# Patient Record
Sex: Female | Born: 1970 | Race: White | Hispanic: No | Smoking: Never smoker
Health system: Southern US, Community
[De-identification: ages and names within clinical notes are randomized; demographics above are authoritative.]

## PROBLEM LIST (undated history)

## (undated) ENCOUNTER — Inpatient Hospital Stay (HOSPITAL_COMMUNITY): Payer: Self-pay

## (undated) DIAGNOSIS — A6000 Herpesviral infection of urogenital system, unspecified: Secondary | ICD-10-CM

## (undated) DIAGNOSIS — F329 Major depressive disorder, single episode, unspecified: Secondary | ICD-10-CM

## (undated) DIAGNOSIS — N200 Calculus of kidney: Secondary | ICD-10-CM

## (undated) DIAGNOSIS — F419 Anxiety disorder, unspecified: Secondary | ICD-10-CM

## (undated) DIAGNOSIS — F32A Depression, unspecified: Secondary | ICD-10-CM

## (undated) DIAGNOSIS — E785 Hyperlipidemia, unspecified: Secondary | ICD-10-CM

## (undated) HISTORY — DX: Anxiety disorder, unspecified: F41.9

## (undated) HISTORY — PX: OTHER SURGICAL HISTORY: SHX169

## (undated) HISTORY — DX: Calculus of kidney: N20.0

## (undated) HISTORY — PX: BREAST EXCISIONAL BIOPSY: SUR124

## (undated) HISTORY — DX: Major depressive disorder, single episode, unspecified: F32.9

## (undated) HISTORY — PX: WISDOM TOOTH EXTRACTION: SHX21

## (undated) HISTORY — DX: Herpesviral infection of urogenital system, unspecified: A60.00

## (undated) HISTORY — DX: Hyperlipidemia, unspecified: E78.5

## (undated) HISTORY — DX: Depression, unspecified: F32.A

---

## 2001-03-25 ENCOUNTER — Emergency Department (HOSPITAL_COMMUNITY): Admission: EM | Admit: 2001-03-25 | Discharge: 2001-03-25 | Payer: Self-pay | Admitting: Gastroenterology

## 2001-03-25 ENCOUNTER — Encounter: Payer: Self-pay | Admitting: Emergency Medicine

## 2003-09-03 ENCOUNTER — Encounter: Admission: RE | Admit: 2003-09-03 | Discharge: 2003-09-03 | Payer: Self-pay | Admitting: Obstetrics & Gynecology

## 2004-04-04 ENCOUNTER — Encounter: Admission: RE | Admit: 2004-04-04 | Discharge: 2004-04-04 | Payer: Self-pay | Admitting: Obstetrics & Gynecology

## 2004-10-30 ENCOUNTER — Encounter: Admission: RE | Admit: 2004-10-30 | Discharge: 2004-10-30 | Payer: Self-pay | Admitting: Obstetrics & Gynecology

## 2005-08-13 ENCOUNTER — Other Ambulatory Visit: Admission: RE | Admit: 2005-08-13 | Discharge: 2005-08-13 | Payer: Self-pay | Admitting: Gynecology

## 2006-04-24 ENCOUNTER — Emergency Department (HOSPITAL_COMMUNITY): Admission: EM | Admit: 2006-04-24 | Discharge: 2006-04-24 | Payer: Self-pay | Admitting: Family Medicine

## 2006-08-28 ENCOUNTER — Other Ambulatory Visit: Admission: RE | Admit: 2006-08-28 | Discharge: 2006-08-28 | Payer: Self-pay | Admitting: Gynecology

## 2007-07-29 ENCOUNTER — Other Ambulatory Visit: Admission: RE | Admit: 2007-07-29 | Discharge: 2007-07-29 | Payer: Self-pay | Admitting: Gynecology

## 2008-07-29 ENCOUNTER — Other Ambulatory Visit: Admission: RE | Admit: 2008-07-29 | Discharge: 2008-07-29 | Payer: Self-pay | Admitting: Gynecology

## 2009-01-18 ENCOUNTER — Ambulatory Visit (HOSPITAL_COMMUNITY): Admission: RE | Admit: 2009-01-18 | Discharge: 2009-01-18 | Payer: Self-pay | Admitting: Gynecology

## 2009-06-20 ENCOUNTER — Ambulatory Visit (HOSPITAL_COMMUNITY): Admission: RE | Admit: 2009-06-20 | Discharge: 2009-06-20 | Payer: Self-pay | Admitting: Gynecology

## 2011-06-01 ENCOUNTER — Ambulatory Visit (HOSPITAL_BASED_OUTPATIENT_CLINIC_OR_DEPARTMENT_OTHER): Payer: BC Managed Care – PPO | Attending: Family Medicine

## 2011-06-01 DIAGNOSIS — G4710 Hypersomnia, unspecified: Secondary | ICD-10-CM | POA: Insufficient documentation

## 2011-06-02 DIAGNOSIS — G4730 Sleep apnea, unspecified: Secondary | ICD-10-CM

## 2011-06-02 DIAGNOSIS — G4710 Hypersomnia, unspecified: Secondary | ICD-10-CM

## 2011-06-04 NOTE — Procedures (Signed)
Sara Schultz, Sara Schultz          ACCOUNT NO.:  [id number]  MEDICAL RECORD NO.:  [id number]           PATIENT TYPE:  OUT  LOCATION:  SLEEP CENTER                 FACILITY:  [facility]  PHYSICIAN:  Clinton D. Maple Hudson, MD, FCCP, FACPDATE OF BIRTH:  DATE OF STUDY:  06/01/2011                           NOCTURNAL POLYSOMNOGRAM  REFERRING PHYSICIAN:  Raynelle Jan, M.D.  INDICATION FOR STUDY:  Hypersomnia with sleep apnea.  EPWORTH SLEEPINESS SCORE:  10/24, BMI 20.8.  Weight 133 pounds, height 67 inches.  Neck 13 inches.  MEDICATIONS:  Home medications are charted and reviewed.  SLEEP ARCHITECTURE:  Total sleep time 288.5 minutes with sleep efficiency 68.6%.  Stage I was 5.2%, stage II 92.2%, stage III absent, REM 2.6% of total sleep time.  Sleep latency 75.5 minutes, REM latency 122 minutes, awake after sleep onset 56 minutes, arousal index 7.9. Bedtime Medication:  Sertraline.  RESPIRATORY DATA:  Apnea/hypopnea index (AHI) 0.2 per hour.  A single event was recorded as a central apnea while nonsupine in nonREM sleep.  OXYGEN DATA:  Mild snoring with oxygen desaturation to a nadir of 94% and a mean oxygen saturation through the study of 96.6% on room air.  CARDIAC DATA:  Normal sinus rhythm.  MOVEMENT-PARASOMNIA:  No significant movement disturbance.  No bathroom trips.  IMPRESSION-RECOMMENDATIONS: 1. Unremarkable sleep architecture for sleep center environment except     for reduced percentage time spent in REM which may reflect first     night in an unfamiliar sleep environment. 2. Insignificant respiratory sleep disturbance with a single event,     within normal limits.  AHI 0.2 per hour (normal adult range is from     0 to 5 per hour).  Mild snoring with oxygen desaturation to a nadir     of 94% and mean oxygen saturation through the study of 96.6% on     room air.    Clinton D. Maple Hudson, MD, [facility], FACP Diplomate, Biomedical engineer of Sleep Medicine Electronically  Signed   CDY/MEDQ  D:  06/02/2011 14:15:47  T:  06/02/2011 19:56:22  Job:  [id number]

## 2011-06-20 ENCOUNTER — Encounter (HOSPITAL_BASED_OUTPATIENT_CLINIC_OR_DEPARTMENT_OTHER): Payer: Self-pay

## 2011-06-26 ENCOUNTER — Telehealth: Payer: Self-pay | Admitting: Pulmonary Disease

## 2011-06-26 NOTE — Telephone Encounter (Signed)
lmom for pt and made her aware we do not have the records but once we get her release of info we will call the referring office and request the info to be sent prior to her appt . Aundra Millet this is just a heads up for you if you would like to try and request these records early--no records in file cabinet as of 06/26/11 at 4:45.

## 2011-06-29 ENCOUNTER — Encounter: Payer: Self-pay | Admitting: Pulmonary Disease

## 2011-06-29 ENCOUNTER — Ambulatory Visit (INDEPENDENT_AMBULATORY_CARE_PROVIDER_SITE_OTHER): Payer: BC Managed Care – PPO | Admitting: Pulmonary Disease

## 2011-06-29 VITALS — BP 126/72 | HR 82 | Temp 98.8°F | Ht 67.0 in | Wt 132.6 lb

## 2011-06-29 DIAGNOSIS — G4700 Insomnia, unspecified: Secondary | ICD-10-CM | POA: Insufficient documentation

## 2011-06-29 MED ORDER — TRAZODONE HCL 50 MG PO TABS: ORAL_TABLET | ORAL | Status: DC

## 2011-06-29 NOTE — Patient Instructions (Signed)
Stop klonopine for now Trial of trazodone 50mg  one at bedtime for first week, and can increase to 2 at bedtime thereafter if needed. Trial of melatonin 3mg  about 3-4 hrs before bedtime Talk with husband about kicking at night? Call me in 3 weeks with your response to the above.

## 2011-06-29 NOTE — Progress Notes (Signed)
  Subjective:    Patient ID: Sara Schultz, female    DOB: 08-14-1971, 40 y.o.   MRN: [id number]  HPI The patient is a 40 year old female who comes in today as a self-referral for sleeping issues.  The patient states that she has been a light sleeper for years, and this dates back to at least her college years.  She has never had issues with sleep initiation, but has very frequent awakenings during the night.  She usually gets back to sleep very quickly, with 5-10 minutes being the maximum.  She has no idea what is causing these awakenings.  Her husband who snores has moved to another room, and the pet has gone with him.  She has adjusted room temperature, has no issues with lighting, has considered pillow and mattress being an issue, and wears ear plugs in order to keep the noise minimized.  She states that her husband has never mentioned kicking during the night, and she denies any symptoms consistent with restless leg syndrome.  She has no issues with sleep walking or other abnormal behaviors according to her husband.  She denies palpitations, and has no history of a seizure disorder.  She has had a recent sleep study that showed no evidence for sleep-disordered breathing or other issues that may cause awakenings.  The patient has tried Benadryl, amitriptyline, as well as Klonopin in the past.  The amitriptyline did help, but she required high enough doses that resulted in side effects.  She has nonrestorative sleep in the mornings, and significant fatigue and lack of concentration at work.  She denies being an "owl".   Review of Systems  Constitutional: Positive for unexpected weight change. Negative for fever.  HENT: Negative for ear pain, nosebleeds, congestion, sore throat, rhinorrhea, sneezing, trouble swallowing, dental problem, postnasal drip and sinus pressure.   Eyes: Negative for redness and itching.  Respiratory: Negative for cough, chest tightness, shortness of breath and wheezing.     Cardiovascular: Negative for palpitations and leg swelling.  Gastrointestinal: Positive for abdominal pain. Negative for nausea and vomiting.  Genitourinary: Negative for dysuria.  Musculoskeletal: Positive for joint swelling.  Skin: Negative for rash.  Neurological: Negative for headaches.  Hematological: Does not bruise/bleed easily.  Psychiatric/Behavioral: Positive for dysphoric mood. The patient is nervous/anxious.        Objective:   Physical Exam Constitutional:  Well developed, no acute distress  HENT:  Nares patent without discharge  Oropharynx without exudate, palate and uvula are normal  Eyes:  Perrla, eomi, no scleral icterus  Neck:  No JVD, no TMG  Cardiovascular:  Normal rate, regular rhythm, no rubs or gallops.  No murmurs        Intact distal pulses  Pulmonary :  Normal breath sounds, no stridor or respiratory distress   No rales, rhonchi, or wheezing  Abdominal:  Soft, nondistended, bowel sounds present.  No tenderness noted.   Musculoskeletal:  No lower extremity edema noted.  Lymph Nodes:  No cervical lymphadenopathy noted  Skin:  No cyanosis noted  Neurologic:  Alert, appropriate, moves all 4 extremities without obvious deficit.         Assessment & Plan:

## 2011-06-29 NOTE — Assessment & Plan Note (Signed)
The patient has a long history of "light sleeping", but currently is having very frequent awakenings at night of unknown origin.  She has worked very hard to improve her sleeping environment, and denies any history suggestive of RLS, arrhythmia, or parasomnia.  She has had a recent sleep study that was normal except for delayed sleep onset and very little slow-wave sleep or REM.  Her issue may simply be related to anxiety that interferes with her sleep at night.  At this point, I would like to try her on trazodone as well as melatonin to see if she sees a difference.  I have also asked her to discuss with her husband possible leg movements or other abnormal behaviors.  She will call in approximately 3 weeks with her response to her treatment trial

## 2011-07-03 ENCOUNTER — Telehealth: Payer: Self-pay | Admitting: Pulmonary Disease

## 2011-07-03 NOTE — Telephone Encounter (Signed)
This may be consistent with the restless leg syndrome, which can cause sleep disruption during the night. Can try requip 0.5mg  after dinner each night for about 3 weeks, and see if this helps.  She can do this in the place of trazodone initially, but if no difference, go back to original plan.
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
## 2011-07-03 NOTE — Telephone Encounter (Signed)
I think she must be calling to advise KC if she is kicking in her sleep- called pt and had to [facility].

## 2011-07-04 MED ORDER — ROPINIROLE HCL 0.5 MG PO TABS: ORAL_TABLET | ORAL | Status: DC

## 2011-07-04 NOTE — Telephone Encounter (Signed)
Patient returning call.

## 2011-07-04 NOTE — Telephone Encounter (Signed)
LMTCB

## 2011-07-04 NOTE — Telephone Encounter (Signed)
Pt is aware of KC's recs and rx for requip has been sent to the pharmacy.

## 2011-07-06 ENCOUNTER — Encounter: Payer: Self-pay | Admitting: Pulmonary Disease

## 2011-11-11 ENCOUNTER — Inpatient Hospital Stay (HOSPITAL_COMMUNITY)
Admission: AD | Admit: 2011-11-11 | Discharge: 2011-11-12 | Disposition: A | Discharge status: Home / Self Care | Payer: BC Managed Care – PPO | Source: Ambulatory Visit | Attending: Gynecology | Admitting: Gynecology

## 2011-11-11 ENCOUNTER — Encounter (HOSPITAL_COMMUNITY): Payer: Self-pay | Admitting: *Deleted

## 2011-11-11 DIAGNOSIS — O039 Complete or unspecified spontaneous abortion without complication: Secondary | ICD-10-CM | POA: Insufficient documentation

## 2011-11-11 NOTE — ED Provider Notes (Signed)
History     CSN: [id number]  Arrival date & time 11/11/11  2229   None     Chief Complaint  Patient presents with  . Miscarriage      HPI Sara Schultz is a 41 y.o. female who presents to [facility] for cramping and bleeding in pregnancy. The bleeding started 2 days ago and then today got heavier with severe cramping. She is a patient of Dr. Chevis Pretty and has known this was a failed pregnancy since [redacted] weeks gestation. The plan was to have a D&C after her URI was better. The history was provided by the patient.  Past Medical History  Diagnosis Date  . Allergic rhinitis     Past Surgical History  Procedure Date  . Wisdom tooth extraction     Family History  Problem Relation Age of Onset  . Heart disease Father   . Throat cancer Maternal Grandfather     History  Substance Use Topics  . Smoking status: Never Smoker   . Smokeless tobacco: Not on file  . Alcohol Use: No    OB History    Grav Para Term Preterm Abortions TAB SAB Ect Mult Living   4 0 0 0 3 0 3 0 0 0       Review of Systems  Constitutional: Negative for fever, chills, diaphoresis and fatigue.  HENT: Negative for ear pain, congestion, sore throat, facial swelling, neck pain, neck stiffness, dental problem and sinus pressure.   Eyes: Negative for photophobia, pain and discharge.  Respiratory: Negative for cough, chest tightness and wheezing.   Gastrointestinal: Positive for nausea and abdominal pain. Negative for vomiting, diarrhea, constipation and abdominal distention.  Genitourinary: Positive for vaginal bleeding and pelvic pain. Negative for dysuria, frequency, flank pain and difficulty urinating.  Musculoskeletal: Positive for back pain. Negative for myalgias and gait problem.  Skin: Negative for color change and rash.  Neurological: Positive for dizziness. Negative for speech difficulty, weakness, light-headedness, numbness and headaches.  Psychiatric/Behavioral: Negative for confusion and agitation.     Allergies  Penicillins  Home Medications  No current outpatient prescriptions on file.  BP 132/82  Pulse 100  Temp(Src) 98.2 F (36.8 C) (Oral)  Resp 20  Ht 5\' 7"  (1.702 m)  Wt 133 lb (60.328 kg)  BMI 20.83 kg/m2  SpO2 98%  Physical Exam  Nursing note and vitals reviewed. Constitutional: She is oriented to person, place, and time. She appears well-developed and well-nourished. No distress.       Uncomfortable appearing.  Eyes: EOM are normal.  Neck: Neck supple.  Cardiovascular: Normal rate.   Pulmonary/Chest: Effort normal.  Abdominal: Soft. There is no tenderness.  Genitourinary:       External genitalia without lesions. Large blood clots vaginal vault. Cervix closed, no CMT, mildly tender bilateral adnexa. Uterus slightly enlarged.  Musculoskeletal: Normal range of motion.  Neurological: She is alert and oriented to person, place, and time. No cranial nerve deficit.  Skin: Skin is warm and dry.  Psychiatric: Her behavior is normal. Judgment and thought content normal. Her mood appears anxious.   Results for orders placed during the hospital encounter of 11/11/11 (from the past 24 hour(s))  CBC     Status: Abnormal   Collection Time   11/12/11 12:21 AM      Component Value Range   WBC 15.4 (*) 4.0 - 10.5 (K/uL)   RBC 4.15  3.87 - 5.11 (MIL/uL)   Hemoglobin 12.3  12.0 - 15.0 (g/dL)   HCT 37.1  36.0 - 46.0 (%)   MCV 89.4  78.0 - 100.0 (fL)   MCH 29.6  26.0 - 34.0 (pg)   MCHC 33.2  30.0 - 36.0 (g/dL)   RDW 19.1  47.8 - 29.5 (%)   Platelets 201  150 - 400 (K/uL)   Assessment: SAB in progress  ED Course: consult with Dr. Henderson Cloud on call for Dr. Chevis Pretty. Will start IV and give medication for pain and observe patient. Will call Dr. Henderson Cloud with update if patient completes SAB or for change in status.  Procedures  04:14 am MDM: Bleeding decreased significantly, no pain at this time. Patient returned from ultrasound.   US Ob Comp Less 14 Wks  11/12/2011  *RADIOLOGY  REPORT*  Clinical Data: Evaluate for retained products of conception.  OBSTETRIC <14 WK [place] AND TRANSVAGINAL OB US  Technique:  Both transabdominal and transvaginal ultrasound examinations were performed for complete evaluation of the gestation as well as the maternal uterus, adnexal regions, and pelvic cul-de-sac.  Transvaginal technique was performed to assess early pregnancy.  Comparison:  None.  No intrauterine gestational sac identified.  The endometrium is thickened at 2 cm and heterogeneous in echogenicity, with internal debris.  There is a small amount of blood flow noted to the endometrial within the fundal portion.  Ovaries have normal sonographic appearance.  No free fluid identified.  IMPRESSION: Thickened heterogeneous endometrium with vascularity to the fundal portion. While this in part likely represents intrauterine blood clots, retained products of conception within the fundal portion is not excluded.  Original Report Authenticated By: Waneta Martins, M.D.   US Ob Transvaginal  11/12/2011  *RADIOLOGY REPORT*  Clinical Data: Evaluate for retained products of conception.  OBSTETRIC <14 WK [place] AND TRANSVAGINAL OB US  Technique:  Both transabdominal and transvaginal ultrasound examinations were performed for complete evaluation of the gestation as well as the maternal uterus, adnexal regions, and pelvic cul-de-sac.  Transvaginal technique was performed to assess early pregnancy.  Comparison:  None.  No intrauterine gestational sac identified.  The endometrium is thickened at 2 cm and heterogeneous in echogenicity, with internal debris.  There is a small amount of blood flow noted to the endometrial within the fundal portion.  Ovaries have normal sonographic appearance.  No free fluid identified.  IMPRESSION: Thickened heterogeneous endometrium with vascularity to the fundal portion. While this in part likely represents intrauterine blood clots, retained products of conception within the fundal  portion is not excluded.  Original Report Authenticated By: Waneta Martins, M.D.   Discussed ultrasound results with Dr. Henderson Cloud and will discharge patient home to follow up with Dr. Chevis Pretty in the office later today.  Assessment: Complete SAB  Plan:  Follow up with Dr. Chevis Pretty in the office today.   Return here as needed.        [place], [place] 11/12/11 [phone]

## 2011-11-11 NOTE — Progress Notes (Signed)
H. Neese, NP at bedside.  Assessment done and poc discussed with pt.  

## 2011-11-11 NOTE — Progress Notes (Signed)
SSE per NP.  VE done.   

## 2011-11-11 NOTE — Progress Notes (Signed)
Pt states she has known she would miscarry since 7 weeks-they were just waiting for it to happen

## 2011-11-12 ENCOUNTER — Other Ambulatory Visit: Payer: Self-pay | Admitting: Gynecology

## 2011-11-12 ENCOUNTER — Inpatient Hospital Stay (HOSPITAL_COMMUNITY): Payer: BC Managed Care – PPO

## 2011-11-12 LAB — DIFFERENTIAL
Eosinophils Absolute: 0.1 10*3/uL (ref 0.0–0.7)
Lymphocytes Relative: 14 % (ref 12–46)
Lymphs Abs: 2.2 10*3/uL (ref 0.7–4.0)
Monocytes Relative: 6 % (ref 3–12)
Neutrophils Relative %: 80 % — ABNORMAL HIGH (ref 43–77)

## 2011-11-12 LAB — CBC
Hemoglobin: 12.3 g/dL (ref 12.0–15.0)
MCH: 29.6 pg (ref 26.0–34.0)
MCHC: 33.2 g/dL (ref 30.0–36.0)
Platelets: 201 10*3/uL (ref 150–400)

## 2011-11-12 LAB — TYPE AND SCREEN: ABO/RH(D): O POS

## 2011-11-12 MED ORDER — LACTATED RINGERS IV SOLN
INTRAVENOUS | Status: DC
  Administered 2011-11-12: via INTRAVENOUS

## 2011-11-12 MED ORDER — HYDROMORPHONE HCL PF 1 MG/ML IJ SOLN
1.0000 mg | Freq: Once | INTRAMUSCULAR | Status: AC
  Administered 2011-11-12: 1 mg via INTRAVENOUS
  Filled 2011-11-12: qty 1

## 2011-11-12 MED ORDER — KETOROLAC TROMETHAMINE 30 MG/ML IJ SOLN
30.0000 mg | Freq: Once | INTRAMUSCULAR | Status: AC
  Administered 2011-11-12: 30 mg via INTRAVENOUS
  Filled 2011-11-12: qty 1

## 2011-11-12 MED ORDER — ONDANSETRON HCL 4 MG/2ML IJ SOLN
4.0000 mg | Freq: Once | INTRAMUSCULAR | Status: AC
  Administered 2011-11-12: 4 mg via INTRAVENOUS
  Filled 2011-11-12: qty 2

## 2012-03-10 ENCOUNTER — Emergency Department (HOSPITAL_COMMUNITY)
Admission: EM | Admit: 2012-03-10 | Discharge: 2012-03-10 | Disposition: A | Discharge status: Home / Self Care | Payer: BC Managed Care – PPO | Source: Home / Self Care

## 2012-03-10 ENCOUNTER — Encounter (HOSPITAL_COMMUNITY): Payer: Self-pay | Admitting: *Deleted

## 2012-03-10 DIAGNOSIS — S0500XA Injury of conjunctiva and corneal abrasion without foreign body, unspecified eye, initial encounter: Secondary | ICD-10-CM

## 2012-03-10 DIAGNOSIS — S058X9A Other injuries of unspecified eye and orbit, initial encounter: Secondary | ICD-10-CM

## 2012-03-10 MED ORDER — TETRACAINE HCL 0.5 % OP SOLN
2.0000 [drp] | Freq: Once | OPHTHALMIC | Status: AC
  Administered 2012-03-10: 2 [drp] via OPHTHALMIC

## 2012-03-10 MED ORDER — ERYTHROMYCIN 5 MG/GM OP OINT: TOPICAL_OINTMENT | Freq: Four times a day (QID) | OPHTHALMIC | Status: AC

## 2012-03-10 MED ORDER — TETRACAINE HCL 0.5 % OP SOLN
OPHTHALMIC | Status: AC
  Filled 2012-03-10: qty 2

## 2012-03-10 NOTE — ED Notes (Signed)
C/O bilat eye irritation since 5/25 - L>R.  Has been using Alaway and Visine eye drops.
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
## 2013-04-02 ENCOUNTER — Ambulatory Visit: Payer: BC Managed Care – PPO | Admitting: Family Medicine

## 2013-04-07 ENCOUNTER — Ambulatory Visit (INDEPENDENT_AMBULATORY_CARE_PROVIDER_SITE_OTHER): Payer: BC Managed Care – PPO | Admitting: Family Medicine

## 2013-04-07 ENCOUNTER — Encounter: Payer: Self-pay | Admitting: Family Medicine

## 2013-04-07 VITALS — BP 120/80 | HR 64 | Temp 97.6°F | Ht 67.0 in | Wt 139.0 lb

## 2013-04-07 DIAGNOSIS — Z136 Encounter for screening for cardiovascular disorders: Secondary | ICD-10-CM

## 2013-04-07 DIAGNOSIS — F329 Major depressive disorder, single episode, unspecified: Secondary | ICD-10-CM

## 2013-04-07 DIAGNOSIS — F32A Depression, unspecified: Secondary | ICD-10-CM | POA: Insufficient documentation

## 2013-04-07 DIAGNOSIS — J309 Allergic rhinitis, unspecified: Secondary | ICD-10-CM

## 2013-04-07 DIAGNOSIS — Z0000 Encounter for general adult medical examination without abnormal findings: Secondary | ICD-10-CM

## 2013-04-07 LAB — COMPREHENSIVE METABOLIC PANEL
Albumin: 4 g/dL (ref 3.5–5.2)
Alkaline Phosphatase: 42 U/L (ref 39–117)
BUN: 15 mg/dL (ref 6–23)
CO2: 27 mEq/L (ref 19–32)
Calcium: 9.3 mg/dL (ref 8.4–10.5)
Chloride: 104 mEq/L (ref 96–112)
GFR: 72.72 mL/min (ref >60.00)
Glucose, Bld: 79 mg/dL (ref 70–99)
Potassium: 4.4 mEq/L (ref 3.5–5.1)

## 2013-04-07 LAB — LIPID PANEL
Cholesterol: 159 mg/dL (ref 0–200)
Triglycerides: 112 mg/dL (ref 0.0–149.0)

## 2013-04-07 MED ORDER — BUPROPION HCL ER (XL) 150 MG PO TB24: 150.0000 mg | ORAL_TABLET | Freq: Every day | ORAL | Status: DC

## 2013-04-07 MED ORDER — FLUTICASONE PROPIONATE 50 MCG/ACT NA SUSP: 1.0000 | Freq: Every day | NASAL | Status: AC

## 2013-04-07 NOTE — Patient Instructions (Addendum)
It was so nice to meet you. Please call me in a few with an update.  We will call you with your lab results.

## 2013-04-07 NOTE — Progress Notes (Signed)
Subjective:    Patient ID: Sara Schultz, female    DOB: [birth year], 42 y.o.   MRN: [id number]  HPI  Very pleasant 42 yo G0 here to establish care.  Depression- chronic issue.  Has been on and off medications for years.   SSRIs, was also on amitriptyline with GI side effects. Husband just returned from [place] so she is feeling a little better but still having some tearfulness, anhedonia, feeling down. No panic attacks. No SI or HI.  Allergic rhinitis- very well controlled on Flonase.  Does not take oral antihistamines.  H/o borderline HLD- would like to recheck this today.  Sees Dr. Chevis Pretty.  Has Mirena IUD.  Patient Active Problem List   Diagnosis Date Noted  . Allergic rhinitis 04/07/2013  . Routine general medical examination at a health care facility 04/07/2013  . Depression   . 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 Allergic rhinitis   . Depression   . Hyperlipidemia    Past Surgical History  Procedure Laterality Date  . Wisdom tooth extraction    . Ivf      Egg retrieval Jan 2011   History  Substance Use Topics  . Smoking status: Never Smoker   . Smokeless tobacco: Not on file  . Alcohol Use: No   Family History  Problem Relation Age of Onset  . Heart disease Father   . Diabetes Father   . Throat cancer Maternal Grandfather   . Multiple sclerosis Mother   . Lupus Mother    Allergies  Allergen Reactions  . Penicillins    Current Outpatient Prescriptions on File Prior to Visit  Medication Sig Dispense Refill  . aspirin 81 MG chewable tablet Chew 81 mg by mouth daily.      Marland Kitchen doxylamine, Sleep, (UNISOM) 25 MG tablet Take 25 mg by mouth at bedtime as needed.      . valACYclovir (VALTREX) 500 MG tablet Take 500 mg by mouth daily.         No current facility-administered medications on file prior to visit.   The PMH, PSH, Social History, Family History, Medications, and  allergies have been reviewed in [facility], and have been updated if relevant.   Review of Systems See HPI No CP No SOB No HA     Objective:   Physical Exam  BP 120/80  Pulse 64  Temp(Src) 97.6 F (36.4 C)  Ht 5\' 7"  (1.702 m)  Wt 139 lb (63.05 kg)  BMI 21.77 kg/m2  Breastfeeding? No  General:  Well-developed,well-nourished,in no acute distress; alert,appropriate and cooperative throughout examination Head:  normocephalic and atraumatic.   Eyes:  vision grossly intact, pupils equal, pupils round, and pupils reactive to light.   Ears:  R ear normal and L ear normal.   Nose:  no external deformity.   Mouth:  good dentition.   Neck:  No deformities, masses, or tenderness noted. Lungs:  Normal respiratory effort, chest expands symmetrically. Lungs are clear to auscultation, no crackles or wheezes. Heart:  Normal rate and regular rhythm. S1 and S2 normal without gallop, murmur, click, rub or other extra sounds. Abdomen:  Bowel sounds positive,abdomen soft and non-tender without masses, organomegaly or hernias noted. Msk:  No deformity or scoliosis noted of thoracic or lumbar spine.   Extremities:  No clubbing, cyanosis, edema, or deformity noted with normal full range of motion of all joints.   Neurologic:  alert & oriented X3 and gait  normal.   Skin:  Intact without suspicious lesions or rashes Cervical Nodes:  No lymphadenopathy noted Axillary Nodes:  No palpable lymphadenopathy Psych:  Cognition and judgment appear intact. Alert and cooperative with normal attention span and concentration. No apparent delusions, illusions, hallucinations     Assessment & Plan:  1. Allergic rhinitis Stable on Flonase. Rx refilled.  2. Routine general medical examination at a health care facility  - Comprehensive metabolic panel - TSH  3. Screening for ischemic heart disease  - Lipid Panel  4. Depression Deteriorated.  Discussed tx options.  We decided for trial of Wellbutrin 150 mg XL  daily. She will call me or email me in 3 weeks with an update.

## 2013-04-08 ENCOUNTER — Encounter: Payer: Self-pay | Admitting: *Deleted

## 2013-05-04 ENCOUNTER — Encounter: Payer: Self-pay | Admitting: Family Medicine

## 2013-05-04 MED ORDER — BUPROPION HCL ER (XL) 150 MG PO TB24: 150.0000 mg | ORAL_TABLET | Freq: Every day | ORAL | Status: DC

## 2013-05-04 NOTE — Telephone Encounter (Signed)
Medicine sent to pharmacy

## 2013-06-23 ENCOUNTER — Encounter: Payer: Self-pay | Admitting: Family Medicine

## 2013-06-24 ENCOUNTER — Other Ambulatory Visit: Payer: Self-pay | Admitting: Family Medicine

## 2013-06-24 MED ORDER — BUPROPION HCL ER (SR) 150 MG PO TB12: 150.0000 mg | ORAL_TABLET | Freq: Two times a day (BID) | ORAL | Status: DC

## 2013-08-13 ENCOUNTER — Other Ambulatory Visit: Payer: Self-pay

## 2014-03-23 ENCOUNTER — Encounter: Payer: Self-pay | Admitting: Family Medicine

## 2014-06-09 ENCOUNTER — Other Ambulatory Visit: Payer: Self-pay | Admitting: Family Medicine

## 2014-06-09 DIAGNOSIS — Z136 Encounter for screening for cardiovascular disorders: Secondary | ICD-10-CM

## 2014-06-09 DIAGNOSIS — Z0000 Encounter for general adult medical examination without abnormal findings: Secondary | ICD-10-CM

## 2014-06-17 ENCOUNTER — Other Ambulatory Visit (INDEPENDENT_AMBULATORY_CARE_PROVIDER_SITE_OTHER): Payer: BC Managed Care – PPO

## 2014-06-17 DIAGNOSIS — Z136 Encounter for screening for cardiovascular disorders: Secondary | ICD-10-CM

## 2014-06-17 DIAGNOSIS — Z0000 Encounter for general adult medical examination without abnormal findings: Secondary | ICD-10-CM

## 2014-06-17 LAB — CBC WITH DIFFERENTIAL/PLATELET
Basophils Absolute: 0 10*3/uL (ref 0.0–0.1)
Basophils Relative: 0.4 % (ref 0.0–3.0)
EOS PCT: 0.4 % (ref 0.0–5.0)
Eosinophils Absolute: 0 10*3/uL (ref 0.0–0.7)
HEMATOCRIT: 43.2 % (ref 36.0–46.0)
HEMOGLOBIN: 14.4 g/dL (ref 12.0–15.0)
LYMPHS ABS: 1.6 10*3/uL (ref 0.7–4.0)
Lymphocytes Relative: 20.5 % (ref 12.0–46.0)
MCHC: 33.3 g/dL (ref 30.0–36.0)
MCV: 93.3 fl (ref 78.0–100.0)
MONO ABS: 0.5 10*3/uL (ref 0.1–1.0)
Monocytes Relative: 6.5 % (ref 3.0–12.0)
NEUTROS ABS: 5.5 10*3/uL (ref 1.4–7.7)
Neutrophils Relative %: 72.2 % (ref 43.0–77.0)
PLATELETS: 189 10*3/uL (ref 150.0–400.0)
RBC: 4.63 Mil/uL (ref 3.87–5.11)
RDW: 12.4 % (ref 11.5–15.5)
WBC: 7.6 10*3/uL (ref 4.0–10.5)

## 2014-06-17 LAB — COMPREHENSIVE METABOLIC PANEL
ALK PHOS: 43 U/L (ref 39–117)
ALT: 15 U/L (ref 0–35)
AST: 20 U/L (ref 0–37)
Albumin: 4.2 g/dL (ref 3.5–5.2)
BILIRUBIN TOTAL: 0.7 mg/dL (ref 0.2–1.2)
BUN: 17 mg/dL (ref 6–23)
CO2: 27 mEq/L (ref 19–32)
CREATININE: 1 mg/dL (ref 0.4–1.2)
Calcium: 9.2 mg/dL (ref 8.4–10.5)
Chloride: 103 mEq/L (ref 96–112)
GFR: 66.37 mL/min (ref >60.00)
GLUCOSE: 83 mg/dL (ref 70–99)
Potassium: 4 mEq/L (ref 3.5–5.1)
SODIUM: 137 meq/L (ref 135–145)
TOTAL PROTEIN: 7.4 g/dL (ref 6.0–8.3)

## 2014-06-17 LAB — LIPID PANEL
CHOLESTEROL: 168 mg/dL (ref 0–200)
HDL: 52 mg/dL (ref >39.00)
LDL CALC: 99 mg/dL (ref 0–99)
NonHDL: 116
TRIGLYCERIDES: 84 mg/dL (ref 0.0–149.0)
Total CHOL/HDL Ratio: 3
VLDL: 16.8 mg/dL (ref 0.0–40.0)

## 2014-06-17 LAB — TSH: TSH: 0.84 u[IU]/mL (ref 0.35–4.50)

## 2014-06-24 ENCOUNTER — Ambulatory Visit (INDEPENDENT_AMBULATORY_CARE_PROVIDER_SITE_OTHER): Payer: BC Managed Care – PPO | Admitting: Family Medicine

## 2014-06-24 ENCOUNTER — Encounter: Payer: Self-pay | Admitting: Family Medicine

## 2014-06-24 VITALS — BP 116/68 | HR 72 | Temp 98.2°F | Ht 66.5 in | Wt 138.5 lb

## 2014-06-24 DIAGNOSIS — F32A Depression, unspecified: Secondary | ICD-10-CM

## 2014-06-24 DIAGNOSIS — G4700 Insomnia, unspecified: Secondary | ICD-10-CM

## 2014-06-24 DIAGNOSIS — F3289 Other specified depressive episodes: Secondary | ICD-10-CM

## 2014-06-24 DIAGNOSIS — F329 Major depressive disorder, single episode, unspecified: Secondary | ICD-10-CM

## 2014-06-24 DIAGNOSIS — Z23 Encounter for immunization: Secondary | ICD-10-CM

## 2014-06-24 DIAGNOSIS — Z0000 Encounter for general adult medical examination without abnormal findings: Secondary | ICD-10-CM

## 2014-06-24 MED ORDER — ALPRAZOLAM 0.25 MG PO TABS: 0.2500 mg | ORAL_TABLET | Freq: Two times a day (BID) | ORAL | Status: DC | PRN

## 2014-06-24 MED ORDER — SERTRALINE HCL 25 MG PO TABS: 25.0000 mg | ORAL_TABLET | Freq: Every day | ORAL | Status: DC

## 2014-06-24 MED ORDER — TRAZODONE HCL 50 MG PO TABS: 25.0000 mg | ORAL_TABLET | Freq: Every evening | ORAL | Status: DC | PRN

## 2014-06-24 NOTE — Assessment & Plan Note (Signed)
Deteriorated due to acute stressors.  The problem of recurrent insomnia is discussed. Avoidance of caffeine sources is strongly encouraged. Sleep hygiene issues are reviewed. The use of sedative hypnotics for temporary relief is appropriate; we discussed the addictive nature of these drugs, and a one-time only prescription for prn use of a hypnotic is given, to use no more than 3 times per week for 2-3 weeks. Trazodone prn as well.

## 2014-06-24 NOTE — Progress Notes (Signed)
Pre visit review using our clinic review tool, if applicable. No additional management support is needed unless otherwise documented below in the visit note. 

## 2014-06-24 NOTE — Progress Notes (Signed)
Subjective:    Patient ID: Sara Schultz, female    DOB: [birth year], 43 y.o.   MRN: [id number]  HPI  Very pleasant 43 yo G0 here for CPX.  I have not seen her since she established care with me in 04/2013.  Sees Dr. Chevis Pretty- has had an IUD for 2 years. Does not have periods.  Sees derm yearly [facility] Danella Deis)- saw her a few months ago for skin survey.  Depression- chronic issue.  Has been on and off medications for years.   SSRIs, was also on amitriptyline with GI side effects. Last July was having some anhedonia and tearfulness- given rx for wellbutrin XL 150 mg daily.   Stopped taking it when she felt better.  In June of this year, she and her husband got separated.  She called about restarting Wellbutrin (see phone note with Dr. Reece Agar) but she decided not to take it.  She is now very anxious and tearful.  Not sleeping well.  Appetite ok.  Weight stable. Wt Readings from Last 3 Encounters:  06/24/14 138 lb 8 oz (62.823 kg)  04/07/13 139 lb (63.05 kg)  11/11/11 133 lb (60.328 kg)   Seeing a therapist weekly.  [employer].  Also has supportive family.  No SI or HI.   Lab Results  Component Value Date   CHOL 168 06/17/2014   HDL 52.00 06/17/2014   LDLCALC 99 06/17/2014   TRIG 84.0 06/17/2014   CHOLHDL 3 06/17/2014   Lab Results  Component Value Date   WBC 7.6 06/17/2014   HGB 14.4 06/17/2014   HCT 43.2 06/17/2014   MCV 93.3 06/17/2014   PLT 189.0 06/17/2014   Lab Results  Component Value Date   CREATININE 1.0 06/17/2014   Lab Results  Component Value Date   TSH 0.84 06/17/2014      Patient Active Problem List   Diagnosis Date Noted  . Allergic rhinitis 04/07/2013  . Routine general medical examination at a health care facility 04/07/2013  . Depression   . 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 Allergic rhinitis   . Depression   . Hyperlipidemia    Past Surgical History  Procedure Laterality Date   . Wisdom tooth extraction    . Ivf      Egg retrieval Jan 2011   History  Substance Use Topics  . Smoking status: Never Smoker   . Smokeless tobacco: Not on file  . Alcohol Use: No   Family History  Problem Relation Age of Onset  . Heart disease Father   . Diabetes Father   . Throat cancer Maternal Grandfather   . Multiple sclerosis Mother   . Lupus Mother    Allergies  Allergen Reactions  . Penicillins    Current Outpatient Prescriptions on File Prior to Visit  Medication Sig Dispense Refill  . doxylamine, Sleep, (UNISOM) 25 MG tablet Take 25 mg by mouth at bedtime as needed.      . fluticasone (FLONASE) 50 MCG/ACT nasal spray Place 1 spray into the nose daily.  16 g  6  . ketotifen (ZADITOR) 0.025 % ophthalmic solution Place 1 drop into both eyes 2 (two) times daily.      Marland Kitchen levonorgestrel (MIRENA) 20 MCG/24HR IUD 1 each by Intrauterine route once.      . Multiple Vitamin (MULTIVITAMIN) tablet Take 1 tablet by mouth daily.      . valACYclovir (VALTREX) 500 MG tablet Take 500  mg by mouth daily.         No current facility-administered medications on file prior to visit.   The PMH, PSH, Social History, Family History, Medications, and allergies have been reviewed in [facility], and have been updated if relevant.   Review of Systems See HPI No CP No SOB No HA +diarrhea at times - has "a nervous stomach." No LE edema No rashes No dysuria No breast tenderness     Objective:   Physical Exam  BP 116/68  Pulse 72  Temp(Src) 98.2 F (36.8 C) (Oral)  Ht 5' 6.5" (1.689 m)  Wt 138 lb 8 oz (62.823 kg)  BMI 22.02 kg/m2  SpO2 98%  General:  Well-developed,well-nourished,in no acute distress; alert,appropriate and cooperative throughout examination Head:  normocephalic and atraumatic.   Eyes:  vision grossly intact, pupils equal, pupils round, and pupils reactive to light.   Ears:  R ear normal and L ear normal.   Nose:  no external deformity.   Mouth:  good dentition.    Neck:  No deformities, masses, or tenderness noted. Lungs:  Normal respiratory effort, chest expands symmetrically. Lungs are clear to auscultation, no crackles or wheezes. Heart:  Normal rate and regular rhythm. S1 and S2 normal without gallop, murmur, click, rub or other extra sounds. Abdomen:  Bowel sounds positive,abdomen soft and non-tender without masses, organomegaly or hernias noted. Msk:  No deformity or scoliosis noted of thoracic or lumbar spine.   Extremities:  No clubbing, cyanosis, edema, or deformity noted with normal full range of motion of all joints.   Neurologic:  alert & oriented X3 and gait normal.   Skin:  Intact without suspicious lesions or rashes Cervical Nodes:  No lymphadenopathy noted Axillary Nodes:  No palpable lymphadenopathy Psych:  Cognition and judgment appear intact. Alert and cooperative with normal attention span and concentration. No apparent delusions, illusions, hallucinations     Assessment & Plan:

## 2014-06-24 NOTE — Assessment & Plan Note (Signed)
Reviewed preventive care protocols, scheduled due services, and updated immunizations Discussed nutrition, exercise, diet, and healthy lifestyle.  Influenza vaccine given 

## 2014-06-24 NOTE — Assessment & Plan Note (Signed)
Deteriorated. With anxiety. Continue psychotherapy. She is willing to try and SSRI again- previously took prozac. Will try low dose zoloft 25 mg daily. Call me in 3 weeks with an update.

## 2014-06-24 NOTE — Patient Instructions (Signed)
It was great to see you. Hang in there.  We are starting Zoloft 25 mg daily, Trazodone 25-50 mg as needed at bedtime for insomnia.  Ok to use xanax as needed with caution for panic attacks.  Call me in a few weeks with an update.

## 2014-08-09 ENCOUNTER — Encounter: Payer: Self-pay | Admitting: Family Medicine

## 2014-08-13 ENCOUNTER — Other Ambulatory Visit: Payer: Self-pay | Admitting: Family Medicine

## 2014-08-16 ENCOUNTER — Other Ambulatory Visit: Payer: Self-pay

## 2014-08-16 MED ORDER — SERTRALINE HCL 25 MG PO TABS: 25.0000 mg | ORAL_TABLET | Freq: Every day | ORAL | Status: DC

## 2014-08-16 NOTE — Telephone Encounter (Signed)
Pt requesting medication refill. Last f/u appt 06/2014. Ok to fill per Dr Dayton MartesAron.

## 2014-08-17 LAB — CYTOLOGY - PAP

## 2014-10-18 ENCOUNTER — Other Ambulatory Visit: Payer: Self-pay | Admitting: Family Medicine

## 2014-10-18 NOTE — Telephone Encounter (Signed)
Rx called in to requested pharmacy 

## 2014-10-18 NOTE — Telephone Encounter (Signed)
Pt requesting medication refill. Last f/u appt 06/2014-CPE. pls advise 

## 2014-11-08 ENCOUNTER — Encounter: Payer: Self-pay | Admitting: Family Medicine

## 2014-11-09 ENCOUNTER — Other Ambulatory Visit: Payer: Self-pay | Admitting: Family Medicine

## 2014-11-09 MED ORDER — SERTRALINE HCL 50 MG PO TABS: 50.0000 mg | ORAL_TABLET | Freq: Every day | ORAL | Status: DC

## 2014-12-28 ENCOUNTER — Ambulatory Visit (INDEPENDENT_AMBULATORY_CARE_PROVIDER_SITE_OTHER): Payer: BLUE CROSS/BLUE SHIELD | Admitting: Family Medicine

## 2014-12-28 ENCOUNTER — Encounter: Payer: Self-pay | Admitting: Family Medicine

## 2014-12-28 VITALS — BP 122/76 | HR 82 | Temp 98.1°F | Wt 134.0 lb

## 2014-12-28 DIAGNOSIS — F329 Major depressive disorder, single episode, unspecified: Secondary | ICD-10-CM

## 2014-12-28 DIAGNOSIS — G4700 Insomnia, unspecified: Secondary | ICD-10-CM

## 2014-12-28 DIAGNOSIS — F418 Other specified anxiety disorders: Secondary | ICD-10-CM | POA: Insufficient documentation

## 2014-12-28 DIAGNOSIS — F32A Depression, unspecified: Secondary | ICD-10-CM

## 2014-12-28 MED ORDER — BUPROPION HCL ER (SR) 150 MG PO TB12: 150.0000 mg | ORAL_TABLET | Freq: Two times a day (BID) | ORAL | Status: DC

## 2014-12-28 NOTE — Assessment & Plan Note (Signed)
>  25 minutes spent in face to face time with patient, >50% spent in counselling or coordination of care Continue to wean off zoloft completely as she is having side effects and no benefit from rx. Discussed tx options- including referral to psychiatry for medication management since she has had multiple intolerances or treatment failures.  She would like to retry Wellbutrin (short acting) twice daily dosing first.  She feels now that she is changing jobs, her sleep pattern will improve. She is willing to see psychiatry if this is not effective. She will call me in a few weeks with an update.

## 2014-12-28 NOTE — Progress Notes (Signed)
Pre visit review using our clinic review tool, if applicable. No additional management support is needed unless otherwise documented below in the visit note. 

## 2014-12-28 NOTE — Progress Notes (Signed)
Subjective:   Patient ID: Sara Schultz, female    DOB: 07-25-1971, 44 y.o.   MRN: [id number]  Sara CoilChristian Benefiel is a pleasant 44 y.o. year old female who presents to clinic today with Follow-up  on 12/28/2014  HPI:  Depression/anxiety- chronic issue. Has been on and off medications for years. SSRIs (including prozac), was also on amitriptyline with GI side effects.  In 2014, tried Wellbutrin XL- had worsening issues with insomnia so we tried short acting twice daily dosing but had the same issue.  Started Prozac this year and increased dose to 50 mg in 11/2014.  She feels like it is making her "feel like a zombie."  No motivation to get out of bed.  Tired.  Not sleeping well.  Already weaned herself back down to Zoloft 25 mg daily.  Trazodone has not been very effective but she is changing jobs and thinks her sleep will improve.  Husband still out of the home and they are proceeding with divorce.  She feels good about this- wants to move on.  Denies SI or HI.  Very supportive family.  Current Outpatient Prescriptions on File Prior to Visit  Medication Sig Dispense Refill  . ALPRAZolam (XANAX) 0.25 MG tablet TAKE 1 TABLET TWICE A DAY AS NEEDED FOR ANXIETY 20 tablet 0  . doxylamine, Sleep, (UNISOM) 25 MG tablet Take 25 mg by mouth at bedtime as needed.    . fluticasone (FLONASE) 50 MCG/ACT nasal spray Place 1 spray into the nose daily. 16 g 6  . ketotifen (ZADITOR) 0.025 % ophthalmic solution Place 1 drop into both eyes 2 (two) times daily.    Marland Kitchen. levonorgestrel (MIRENA) 20 MCG/24HR IUD 1 each by Intrauterine route once.    . Multiple Vitamin (MULTIVITAMIN) tablet Take 1 tablet by mouth daily.    . valACYclovir (VALTREX) 500 MG tablet Take 500 mg by mouth daily.       No current facility-administered medications on file prior to visit.    Allergies  Allergen Reactions  . Penicillins     Past Medical History  Diagnosis Date  . Allergic rhinitis   . Depression   .  Hyperlipidemia     Past Surgical History  Procedure Laterality Date  . Wisdom tooth extraction    . Ivf      Egg retrieval Jan 2011    Family History  Problem Relation Age of Onset  . Heart disease Father   . Diabetes Father   . Throat cancer Maternal Grandfather   . Multiple sclerosis Mother   . Lupus Mother     History   Social History  . Marital Status: Married    Spouse Name: N/A  . Number of Children: N  . Years of Education: N/A   Occupational History  . Insurance claims handlertechnical advisor    Social History Main Topics  . Smoking status: Never Smoker   . Smokeless tobacco: Not on file  . Alcohol Use: No  . Drug Use: No  . Sexual Activity: Yes    Birth Control/ Protection: Pill   Other Topics Concern  . Not on file   Social History Narrative   The PMH, PSH, Social History, Family History, Medications, and allergies [facility], and have been updated if relevant.     Review of Systems  Constitutional: Positive for fatigue.  Neurological: Negative.   Psychiatric/Behavioral: Positive for sleep disturbance, dysphoric mood and decreased concentration. Negative for suicidal ideas, hallucinations, behavioral problems, confusion, self-injury and agitation. The  patient is nervous/anxious. The patient is not hyperactive.   All other systems reviewed and are negative.      Objective:    BP 122/76 mmHg  Pulse 82  Temp(Src) 98.1 F (36.7 C) (Oral)  Wt 134 lb (60.782 kg)  SpO2 97%   Physical Exam  Constitutional: She is oriented to person, place, and time. She appears well-developed and well-nourished. No distress.  HENT:  Head: Normocephalic.  Eyes: Conjunctivae are normal.  Neck: Normal range of motion.  Cardiovascular: Normal rate.   Pulmonary/Chest: Effort normal.  Musculoskeletal: Normal range of motion. She exhibits no edema.  Neurological: She is alert and oriented to person, place, and time. No cranial nerve deficit.  Skin: Skin is warm and dry.    Psychiatric: She has a normal mood and affect. Her behavior is normal. Judgment and thought content normal.  Nursing note and vitals reviewed.         Assessment & Plan:   Depression  Depression with anxiety No Follow-up on file.

## 2014-12-28 NOTE — Patient Instructions (Signed)
Great to see you.  Let's wean off zoloft- 25 mg tablet every other day for 1 week.  If you feel ok at that point, you can stop.  If not, take 1/2 tablet every other day for another week.  We are starting Wellbutrin 150 mg twice daily.  Please call me in a few weeks with an update.

## 2015-01-10 ENCOUNTER — Other Ambulatory Visit: Payer: Self-pay | Admitting: Family Medicine

## 2015-01-11 NOTE — Telephone Encounter (Signed)
[facility] [address]

## 2015-01-11 NOTE — Telephone Encounter (Signed)
Last office visit 12/28/2014.  Last refilled 10/18/2014 for #20 with no refills.  Ok to refill?

## 2015-03-14 ENCOUNTER — Other Ambulatory Visit: Payer: Self-pay | Admitting: Gynecology

## 2015-03-14 DIAGNOSIS — R928 Other abnormal and inconclusive findings on diagnostic imaging of breast: Secondary | ICD-10-CM

## 2015-03-27 ENCOUNTER — Other Ambulatory Visit: Payer: Self-pay | Admitting: Family Medicine

## 2015-03-28 NOTE — Telephone Encounter (Signed)
Rx called in to requested pharmacy 

## 2015-03-28 NOTE — Telephone Encounter (Signed)
Last f/u appt 12/2014 

## 2015-03-30 ENCOUNTER — Ambulatory Visit
Admission: RE | Admit: 2015-03-30 | Discharge: 2015-03-30 | Disposition: A | Discharge status: Home / Self Care | Payer: BLUE CROSS/BLUE SHIELD | Source: Ambulatory Visit | Attending: Gynecology | Admitting: Gynecology

## 2015-03-30 ENCOUNTER — Other Ambulatory Visit: Payer: Self-pay | Admitting: Gynecology

## 2015-03-30 DIAGNOSIS — R928 Other abnormal and inconclusive findings on diagnostic imaging of breast: Secondary | ICD-10-CM

## 2015-04-06 ENCOUNTER — Other Ambulatory Visit: Payer: BLUE CROSS/BLUE SHIELD

## 2015-04-07 ENCOUNTER — Inpatient Hospital Stay: Admission: RE | Admit: 2015-04-07 | Payer: BLUE CROSS/BLUE SHIELD | Source: Ambulatory Visit

## 2015-04-12 ENCOUNTER — Ambulatory Visit
Admission: RE | Admit: 2015-04-12 | Discharge: 2015-04-12 | Disposition: A | Discharge status: Home / Self Care | Payer: BLUE CROSS/BLUE SHIELD | Source: Ambulatory Visit | Attending: Gynecology | Admitting: Gynecology

## 2015-04-12 ENCOUNTER — Other Ambulatory Visit: Payer: Self-pay | Admitting: Gynecology

## 2015-04-12 DIAGNOSIS — R928 Other abnormal and inconclusive findings on diagnostic imaging of breast: Secondary | ICD-10-CM

## 2015-04-26 ENCOUNTER — Other Ambulatory Visit: Payer: Self-pay | Admitting: General Surgery

## 2015-04-26 DIAGNOSIS — N6489 Other specified disorders of breast: Secondary | ICD-10-CM

## 2015-05-04 ENCOUNTER — Other Ambulatory Visit: Payer: Self-pay | Admitting: General Surgery

## 2015-05-04 DIAGNOSIS — N6489 Other specified disorders of breast: Secondary | ICD-10-CM

## 2015-05-17 ENCOUNTER — Encounter (HOSPITAL_BASED_OUTPATIENT_CLINIC_OR_DEPARTMENT_OTHER): Payer: Self-pay | Admitting: *Deleted

## 2015-05-18 ENCOUNTER — Ambulatory Visit
Admission: RE | Admit: 2015-05-18 | Discharge: 2015-05-18 | Disposition: A | Discharge status: Home / Self Care | Payer: BLUE CROSS/BLUE SHIELD | Source: Ambulatory Visit | Attending: General Surgery | Admitting: General Surgery

## 2015-05-18 DIAGNOSIS — N6489 Other specified disorders of breast: Secondary | ICD-10-CM

## 2015-05-23 ENCOUNTER — Ambulatory Visit (HOSPITAL_BASED_OUTPATIENT_CLINIC_OR_DEPARTMENT_OTHER): Payer: BLUE CROSS/BLUE SHIELD | Admitting: Anesthesiology

## 2015-05-23 ENCOUNTER — Ambulatory Visit (HOSPITAL_BASED_OUTPATIENT_CLINIC_OR_DEPARTMENT_OTHER)
Admission: RE | Admit: 2015-05-23 | Discharge: 2015-05-23 | Disposition: A | Discharge status: Home / Self Care | Payer: BLUE CROSS/BLUE SHIELD | Source: Ambulatory Visit | Attending: General Surgery | Admitting: General Surgery

## 2015-05-23 ENCOUNTER — Encounter (HOSPITAL_BASED_OUTPATIENT_CLINIC_OR_DEPARTMENT_OTHER): Payer: Self-pay | Admitting: *Deleted

## 2015-05-23 ENCOUNTER — Ambulatory Visit
Admission: RE | Admit: 2015-05-23 | Discharge: 2015-05-23 | Disposition: A | Discharge status: Home / Self Care | Payer: BLUE CROSS/BLUE SHIELD | Source: Ambulatory Visit | Attending: General Surgery | Admitting: General Surgery

## 2015-05-23 ENCOUNTER — Encounter (HOSPITAL_BASED_OUTPATIENT_CLINIC_OR_DEPARTMENT_OTHER)
Admission: RE | Disposition: A | Discharge status: Home / Self Care | Payer: Self-pay | Source: Ambulatory Visit | Attending: General Surgery

## 2015-05-23 DIAGNOSIS — F329 Major depressive disorder, single episode, unspecified: Secondary | ICD-10-CM | POA: Insufficient documentation

## 2015-05-23 DIAGNOSIS — Z803 Family history of malignant neoplasm of breast: Secondary | ICD-10-CM | POA: Insufficient documentation

## 2015-05-23 DIAGNOSIS — N6489 Other specified disorders of breast: Secondary | ICD-10-CM | POA: Diagnosis not present

## 2015-05-23 DIAGNOSIS — F419 Anxiety disorder, unspecified: Secondary | ICD-10-CM | POA: Diagnosis not present

## 2015-05-23 DIAGNOSIS — Z880 Allergy status to penicillin: Secondary | ICD-10-CM | POA: Insufficient documentation

## 2015-05-23 DIAGNOSIS — L905 Scar conditions and fibrosis of skin: Secondary | ICD-10-CM | POA: Insufficient documentation

## 2015-05-23 HISTORY — PX: BREAST LUMPECTOMY WITH RADIOACTIVE SEED LOCALIZATION: SHX6424

## 2015-05-23 LAB — POCT HEMOGLOBIN-HEMACUE: HEMOGLOBIN: 14.5 g/dL (ref 12.0–15.0)

## 2015-05-23 SURGERY — BREAST LUMPECTOMY WITH RADIOACTIVE SEED LOCALIZATION
Anesthesia: General | Site: Breast | Laterality: Right

## 2015-05-23 MED ORDER — HYDROMORPHONE HCL 1 MG/ML IJ SOLN: 0.2500 mg | INTRAMUSCULAR | Status: DC | PRN

## 2015-05-23 MED ORDER — MEPERIDINE HCL 25 MG/ML IJ SOLN: 6.2500 mg | INTRAMUSCULAR | Status: DC | PRN

## 2015-05-23 MED ORDER — FENTANYL CITRATE (PF) 100 MCG/2ML IJ SOLN
50.0000 ug | INTRAMUSCULAR | Status: AC | PRN
  Administered 2015-05-23: 100 ug via INTRAVENOUS
  Administered 2015-05-23 (×2): 50 ug via INTRAVENOUS

## 2015-05-23 MED ORDER — LIDOCAINE HCL (CARDIAC) 20 MG/ML IV SOLN
INTRAVENOUS | Status: DC | PRN
  Administered 2015-05-23: 75 mg via INTRAVENOUS

## 2015-05-23 MED ORDER — LACTATED RINGERS IV SOLN
INTRAVENOUS | Status: DC
  Administered 2015-05-23: 12:00:00 via INTRAVENOUS
  Administered 2015-05-23: 10 mL/h via INTRAVENOUS

## 2015-05-23 MED ORDER — DEXAMETHASONE SODIUM PHOSPHATE 4 MG/ML IJ SOLN
INTRAMUSCULAR | Status: DC | PRN
  Administered 2015-05-23: 10 mg via INTRAVENOUS

## 2015-05-23 MED ORDER — FENTANYL CITRATE (PF) 100 MCG/2ML IJ SOLN
INTRAMUSCULAR | Status: AC
  Filled 2015-05-23: qty 6

## 2015-05-23 MED ORDER — SCOPOLAMINE 1 MG/3DAYS TD PT72
MEDICATED_PATCH | TRANSDERMAL | Status: AC
  Filled 2015-05-23: qty 1

## 2015-05-23 MED ORDER — GLYCOPYRROLATE 0.2 MG/ML IJ SOLN: 0.2000 mg | Freq: Once | INTRAMUSCULAR | Status: DC | PRN

## 2015-05-23 MED ORDER — SCOPOLAMINE 1 MG/3DAYS TD PT72
1.0000 | MEDICATED_PATCH | Freq: Once | TRANSDERMAL | Status: DC | PRN
  Administered 2015-05-23: 1.5 mg via TRANSDERMAL

## 2015-05-23 MED ORDER — BUPIVACAINE-EPINEPHRINE (PF) 0.25% -1:200000 IJ SOLN
INTRAMUSCULAR | Status: AC
  Filled 2015-05-23: qty 30

## 2015-05-23 MED ORDER — VANCOMYCIN HCL IN DEXTROSE 1-5 GM/200ML-% IV SOLN
INTRAVENOUS | Status: AC
  Filled 2015-05-23: qty 200

## 2015-05-23 MED ORDER — BUPIVACAINE HCL (PF) 0.25 % IJ SOLN
INTRAMUSCULAR | Status: AC
  Filled 2015-05-23: qty 30

## 2015-05-23 MED ORDER — CHLORHEXIDINE GLUCONATE 4 % EX LIQD: 1.0000 "application " | Freq: Once | CUTANEOUS | Status: DC

## 2015-05-23 MED ORDER — PROPOFOL 10 MG/ML IV BOLUS
INTRAVENOUS | Status: DC | PRN
  Administered 2015-05-23: 200 mg via INTRAVENOUS

## 2015-05-23 MED ORDER — ONDANSETRON HCL 4 MG/2ML IJ SOLN
INTRAMUSCULAR | Status: DC | PRN
  Administered 2015-05-23: 4 mg via INTRAVENOUS

## 2015-05-23 MED ORDER — MIDAZOLAM HCL 2 MG/2ML IJ SOLN
INTRAMUSCULAR | Status: AC
  Filled 2015-05-23: qty 2

## 2015-05-23 MED ORDER — VANCOMYCIN HCL IN DEXTROSE 1-5 GM/200ML-% IV SOLN
1000.0000 mg | INTRAVENOUS | Status: AC
  Administered 2015-05-23: 1000 mg via INTRAVENOUS

## 2015-05-23 MED ORDER — BUPIVACAINE HCL (PF) 0.25 % IJ SOLN
INTRAMUSCULAR | Status: DC | PRN
  Administered 2015-05-23: 12 mL

## 2015-05-23 MED ORDER — OXYCODONE-ACETAMINOPHEN 5-325 MG PO TABS: 1.0000 | ORAL_TABLET | ORAL | Status: DC | PRN

## 2015-05-23 MED ORDER — PROMETHAZINE HCL 25 MG/ML IJ SOLN
6.2500 mg | INTRAMUSCULAR | Status: DC | PRN
Start: 2015-05-23 — End: 2015-05-23

## 2015-05-23 MED ORDER — MIDAZOLAM HCL 2 MG/2ML IJ SOLN
1.0000 mg | INTRAMUSCULAR | Status: DC | PRN
  Administered 2015-05-23: 2 mg via INTRAVENOUS

## 2015-05-23 SURGICAL SUPPLY — 41 items
APPLIER CLIP 9.375 MED OPEN (MISCELLANEOUS) ×2
APR CLP MED 9.3 20 MLT OPN (MISCELLANEOUS) ×1
BLADE SURG 15 STRL LF DISP TIS (BLADE) ×1 IMPLANT
BLADE SURG 15 STRL SS (BLADE) ×2
CANISTER SUC SOCK COL 7IN (MISCELLANEOUS) ×2 IMPLANT
CANISTER SUCT 1200ML W/VALVE (MISCELLANEOUS) ×2 IMPLANT
CHLORAPREP W/TINT 26ML (MISCELLANEOUS) ×2 IMPLANT
CLIP APPLIE 9.375 MED OPEN (MISCELLANEOUS) ×1 IMPLANT
COVER BACK TABLE 60X90IN (DRAPES) ×2 IMPLANT
COVER MAYO STAND STRL (DRAPES) ×2 IMPLANT
COVER PROBE W GEL 5X96 (DRAPES) ×2 IMPLANT
DECANTER SPIKE VIAL GLASS SM (MISCELLANEOUS) IMPLANT
DEVICE DUBIN W/COMP PLATE 8390 (MISCELLANEOUS) ×2 IMPLANT
DRAPE LAPAROSCOPIC ABDOMINAL (DRAPES) ×1 IMPLANT
DRAPE UTILITY XL STRL (DRAPES) ×2 IMPLANT
ELECT COATED BLADE 2.86 ST (ELECTRODE) ×2 IMPLANT
ELECT REM PT RETURN 9FT ADLT (ELECTROSURGICAL) ×2
ELECTRODE REM PT RTRN 9FT ADLT (ELECTROSURGICAL) ×1 IMPLANT
GLOVE BIO SURGEON STRL SZ 6.5 (GLOVE) ×1 IMPLANT
GLOVE BIO SURGEON STRL SZ7.5 (GLOVE) ×5 IMPLANT
GLOVE BIOGEL PI IND STRL 6.5 (GLOVE) IMPLANT
GLOVE BIOGEL PI INDICATOR 6.5 (GLOVE) ×1
GOWN STRL REUS W/ TWL LRG LVL3 (GOWN DISPOSABLE) ×2 IMPLANT
GOWN STRL REUS W/TWL LRG LVL3 (GOWN DISPOSABLE) ×6
KIT MARKER MARGIN INK (KITS) ×2 IMPLANT
LIQUID BAND (GAUZE/BANDAGES/DRESSINGS) ×2 IMPLANT
NDL HYPO 25X1 1.5 SAFETY (NEEDLE) IMPLANT
NEEDLE HYPO 25X1 1.5 SAFETY (NEEDLE) IMPLANT
NS IRRIG 1000ML POUR BTL (IV SOLUTION) IMPLANT
PACK BASIN DAY SURGERY FS (CUSTOM PROCEDURE TRAY) ×2 IMPLANT
PENCIL BUTTON HOLSTER BLD 10FT (ELECTRODE) ×2 IMPLANT
SLEEVE SCD COMPRESS KNEE MED (MISCELLANEOUS) ×2 IMPLANT
SPONGE LAP 18X18 X RAY DECT (DISPOSABLE) ×2 IMPLANT
SUT MON AB 4-0 PC3 18 (SUTURE) IMPLANT
SUT SILK 2 0 SH (SUTURE) IMPLANT
SUT VICRYL 3-0 CR8 SH (SUTURE) ×2 IMPLANT
SYR CONTROL 10ML LL (SYRINGE) IMPLANT
TOWEL OR 17X24 6PK STRL BLUE (TOWEL DISPOSABLE) ×2 IMPLANT
TOWEL OR NON WOVEN STRL DISP B (DISPOSABLE) ×2 IMPLANT
TUBE CONNECTING 20X1/4 (TUBING) ×2 IMPLANT
YANKAUER SUCT BULB TIP NO VENT (SUCTIONS) IMPLANT

## 2015-05-23 NOTE — Discharge Instructions (Signed)

## 2015-05-23 NOTE — Anesthesia Procedure Notes (Signed)
Procedure Name: LMA Insertion Date/Time: 05/23/2015 12:23 PM Performed by: Zenia Resides D Pre-anesthesia Checklist: Patient identified, Emergency Drugs available, Suction available and Patient being monitored Patient Re-evaluated:Patient Re-evaluated prior to inductionOxygen Delivery Method: Circle System Utilized Preoxygenation: Pre-oxygenation with 100% oxygen Intubation Type: IV induction Ventilation: Mask ventilation without difficulty LMA: LMA inserted LMA Size: 4.0 Number of attempts: 1 Airway Equipment and Method: Bite block Placement Confirmation: positive ETCO2 Tube secured with: Tape Dental Injury: Teeth and Oropharynx as per pre-operative assessment

## 2015-05-23 NOTE — Transfer of Care (Signed)
Immediate Anesthesia Transfer of Care Note  Patient: Sara Schultz  Procedure(s) Performed: Procedure(s): RIGHT BREAST LUMPECTOMY WITH RADIOACTIVE SEED LOCALIZATION (Right)  Patient Location: PACU  Anesthesia Type:General  Level of Consciousness: sedated  Airway & Oxygen Therapy: Patient Spontanous Breathing and Patient connected to face mask oxygen  Post-op Assessment: Report given to RN and Post -op Vital signs reviewed and stable  Post vital signs: Reviewed and stable  Last Vitals:  Filed Vitals:   05/23/15 1311  BP:   Pulse: 69  Temp:   Resp: 13    Complications: No apparent anesthesia complications

## 2015-05-23 NOTE — Anesthesia Preprocedure Evaluation (Addendum)
Anesthesia Evaluation  Patient identified by MRN, date of birth, ID band Patient awake    Reviewed: Allergy & Precautions, NPO status , Patient's Chart, lab work & pertinent test results  Airway Mallampati: II  TM Distance: >3 FB Neck ROM: Full    Dental no notable dental hx. (+) Teeth Intact, Dental Advisory Given   Pulmonary neg pulmonary ROS,  breath sounds clear to auscultation  Pulmonary exam normal       Cardiovascular negative cardio ROS Normal cardiovascular examRhythm:Regular Rate:Normal     Neuro/Psych negative neurological ROS  negative psych ROS   GI/Hepatic negative GI ROS, Neg liver ROS,   Endo/Other  negative endocrine ROS  Renal/GU negative Renal ROS  negative genitourinary   Musculoskeletal negative musculoskeletal ROS (+)   Abdominal   Peds negative pediatric ROS (+)  Hematology negative hematology ROS (+)   Anesthesia Other Findings   Reproductive/Obstetrics negative OB ROS                            Anesthesia Physical Anesthesia Plan  ASA: II  Anesthesia Plan: General   Post-op Pain Management:    Induction: Intravenous  Airway Management Planned: LMA  Additional Equipment:   Intra-op Plan:   Post-operative Plan: Extubation in OR  Informed Consent: I have reviewed the patients History and Physical, chart, labs and discussed the procedure including the risks, benefits and alternatives for the proposed anesthesia with the patient or authorized representative who has indicated his/her understanding and acceptance.   Dental advisory given  Plan Discussed with: CRNA  Anesthesia Plan Comments:         Anesthesia Quick Evaluation

## 2015-05-23 NOTE — H&P (Signed)
Sara Schultz 04/26/2015 11:23 AM Location: [facility] Patient #: [id number] DOB: [birth year] Single / Language: [place] / Race: White Female  History of Present Illness Sara Schultz. Sara Edouard MD; 04/26/2015 12:11 PM) Patient words: new breast lesion.  The patient is a 44 year old female who presents with a breast mass. We are asked to see the patient in consultation by Dr. Laveda Abbe to evaluate her for a right breast mass. The patient is a 44 year old white female who recently went for a routine screening mammogram. At that time she was found to have an area of distortion in the 12:30 position of the right breast. She denied any breast pain or discharge from the nipple. This area was biopsied and came back as a complex sclerosing lesion or radial scar. Her only family history of breast cancer is in a maternal great-grandmother.   Other Problems Sara Schultz; 04/26/2015 11:25 AM) Anxiety Disorder Back Pain Depression Hemorrhoids Other disease, cancer, significant illness  Past Surgical History Sara Schultz; 04/26/2015 11:25 AM) Breast Biopsy Right. Oral Surgery  Diagnostic Studies History Sara Schultz; 04/26/2015 11:25 AM) Colonoscopy never Mammogram within last year Pap Smear 1-5 years ago  Allergies Sara Schultz; 04/26/2015 11:24 AM) Penicillin G Sodium *PENICILLINS*  Medication History ([name]; 04/26/2015 11:25 AM) ALPRAZolam (0.25MG  Tablet, Oral as needed) Active. Flonase (50MCG/ACT Suspension, Nasal) Active. Mirena (52 MG) (20MCG/24HR IUD, Intrauterine) Active. Multivitamin Adult (Oral) Active. ValACYclovir HCl (  Tablet, Oral) Active. Medications Reconciled  Social History Sara Schultz; 04/26/2015 11:25 AM) Alcohol use Occasional alcohol use. Caffeine use Tea. No drug use Tobacco use Never smoker.  Family History Sara Schultz; 04/26/2015 11:25 AM) Arthritis Family Members In General, Mother. Breast Cancer Family  Members In General. Depression Family Members In General, Father. Diabetes Mellitus Father. Heart Disease Family Members In General, Father. Heart disease in female family member before age 43 Hypertension Father, Mother. Malignant Neoplasm Of Pancreas Family Members In General. Migraine Headache Father. Respiratory Condition Family Members In General. Thyroid problems Mother.  Pregnancy / Birth History Sara Schultz; 04/26/2015 11:25 AM) Age at menarche 14 years. Contraceptive History Intrauterine device, Oral contraceptives. Gravida 5 Irregular periods Maternal age 29-35 Para 0  Review of Systems Sara Schultz; 04/26/2015 11:25 AM) General Not Present- Appetite Loss, Chills, Fatigue, Fever, Night Sweats, Weight Gain and Weight Loss. Skin Not Present- Change in Wart/Mole, Dryness, Hives, Jaundice, New Lesions, Non-Healing Wounds, Rash and Ulcer. HEENT Present- Seasonal Allergies. Not Present- Earache, Hearing Loss, Hoarseness, Nose Bleed, Oral Ulcers, Ringing in the Ears, Sinus Pain, Sore Throat, Visual Disturbances, Wears glasses/contact lenses and Yellow Eyes. Respiratory Not Present- Bloody sputum, Chronic Cough, Difficulty Breathing, Snoring and Wheezing. Breast Not Present- Breast Mass, Breast Pain, Nipple Discharge and Skin Changes. Cardiovascular Not Present- Chest Pain, Difficulty Breathing Lying Down, Leg Cramps, Palpitations, Rapid Heart Rate, Shortness of Breath and Swelling of Extremities. Gastrointestinal Present- Hemorrhoids. Not Present- Abdominal Pain, Bloating, Bloody Stool, Change in Bowel Habits, Chronic diarrhea, Constipation, Difficulty Swallowing, Excessive gas, Gets full quickly at meals, Indigestion, Nausea, Rectal Pain and Vomiting. Female Genitourinary Not Present- Frequency, Nocturia, Painful Urination, Pelvic Pain and Urgency. Musculoskeletal Not Present- Back Pain, Joint Pain, Joint Stiffness, Muscle Pain, Muscle Weakness and Swelling of  Extremities. Neurological Not Present- Decreased Memory, Fainting, Headaches, Numbness, Seizures, Tingling, Tremor, Trouble walking and Weakness. Psychiatric Present- Anxiety. Not Present- Bipolar, Change in Sleep Pattern, Depression, Fearful and Frequent crying. Endocrine Not Present- Cold Intolerance, Excessive Hunger, Hair Changes, Heat Intolerance, Hot flashes and New Diabetes. Hematology Not Present-  Easy Bruising, Excessive bleeding, Gland problems, HIV and Persistent Infections.   Vitals Sara Schultz; 04/26/2015 11:23 AM) 04/26/2015 11:23 AM Weight: 136.6 lb Height: 67in Body Surface Area: 1.71 m Body Mass Index: 21.39 kg/m Temp.: 98.54F(Oral)  Pulse: 68 (Regular)  BP: 124/70 (Sitting, Left Arm, Standard)    Physical Exam [name] S. Sara Edouard MD; 04/26/2015 12:12 PM) General Mental Status-Alert. General Appearance-Consistent with stated age. Hydration-Well hydrated. Voice-Normal.  Head and Neck Head-normocephalic, atraumatic with no lesions or palpable masses. Trachea-midline. Thyroid Gland Characteristics - normal size and consistency.  Eye Eyeball - Bilateral-Extraocular movements intact. Sclera/Conjunctiva - Bilateral-No scleral icterus.  Chest and Lung Exam Chest and lung exam reveals -quiet, even and easy respiratory effort with no use of accessory muscles and on auscultation, normal breath sounds, no adventitious sounds and normal vocal resonance. Inspection Chest Wall - Normal. Back - normal.  Breast Note: There is a palpable bruising in the right breast upper inner quadrant near the areolar. Other than this there is no other palpable mass in either breast. There is no palpable axillary, supraclavicular, or cervical lymphadenopathy. She does have dense symmetric breast tissue bilaterally.   Cardiovascular Cardiovascular examination reveals -normal heart sounds, regular rate and rhythm with no murmurs and normal pedal pulses  bilaterally.  Abdomen Inspection Inspection of the abdomen reveals - No Hernias. Skin - Scar - no surgical scars. Palpation/Percussion Palpation and Percussion of the abdomen reveal - Soft, Non Tender, No Rebound tenderness, No Rigidity (guarding) and No hepatosplenomegaly. Auscultation Auscultation of the abdomen reveals - Bowel sounds normal.  Neurologic Neurologic evaluation reveals -alert and oriented x 3 with no impairment of recent or remote memory. Mental Status-Normal.  Musculoskeletal Normal Exam - Left-Upper Extremity Strength Normal and Lower Extremity Strength Normal. Normal Exam - Right-Upper Extremity Strength Normal and Lower Extremity Strength Normal.  Lymphatic Head & Neck  General Head & Neck Lymphatics: Bilateral - Description - Normal. Axillary  General Axillary Region: Bilateral - Description - Normal. Tenderness - Non Tender. Femoral & Inguinal  Generalized Femoral & Inguinal Lymphatics: Bilateral - Description - Normal. Tenderness - Non Tender.    Assessment & Plan [name] S. Sara Edouard MD; 04/26/2015 12:00 PM) RADIAL SCAR OF BREAST (611.89  N64.89) Impression: The patient has an area of radial scar in the 12:30 position of the right breast. Because of its appearance on mammogram and because it is considered a high risk lesion think she would benefit from having this area removed. She would also like to have this done. I have discussed with her in detail the risks and benefits of the operation to remove this area as well as some of the technical aspects and she understands and wishes to proceed. I will plan for a right breast radioactive seed localized lumpectomy Current Plans  Pt Education - Breast Diseases: discussed with patient and provided information.   Signed by Caleen Essex, MD (04/26/2015 12:15 PM)

## 2015-05-23 NOTE — Op Note (Signed)
05/23/2015  1:02 PM  PATIENT:  Sara Schultz  44 y.o. female  PRE-OPERATIVE DIAGNOSIS:  R Breast Radial Scar  POST-OPERATIVE DIAGNOSIS:  R Breast Radial Scar  PROCEDURE:  Procedure(s): RIGHT BREAST LUMPECTOMY WITH RADIOACTIVE SEED LOCALIZATION (Right)  SURGEON:  Surgeon(s) and Role:    * Griselda Miner, MD - Primary  PHYSICIAN ASSISTANT:   ASSISTANTS: none   ANESTHESIA:   general  EBL:     BLOOD ADMINISTERED:none  DRAINS: none   LOCAL MEDICATIONS USED:  MARCAINE     SPECIMEN:  Source of Specimen:  right breast tissue  DISPOSITION OF SPECIMEN:  PATHOLOGY  COUNTS:  YES  TOURNIQUET:  * No tourniquets in log *  DICTATION: .Dragon Dictation  After informed consent was obtained the patient was brought to the operating room and placed in the supine position on the operating room table. After adequate induction of general anesthesia the patient's right breast was prepped with ChloraPrep, allowed to dry, and draped in usual sterile manner. Previously an I-125 radioactive seed was placed in the upper aspect of the right breast to mark an area of radial scar. The neoprobe was set to I-125 in the area of radioactivity was easily identified in the 12:00 position of the right breast near the edge of the areola. A curvilinear incision was made with a 15 blade knife along the edge of the areola in the upper portion of the right breast. The incision was carried through the skin and subcutaneous tissue sharply with the electrocautery. While checking the area of radioactivity frequently with the neoprobe a circular portion of breast tissue was excised sharply around the radioactive seed. Once the specimen was removed it was oriented with the appropriate paint colors. I-125 radioactivity was confirmed in the specimen. There was no residual I-125 radioactivity in the breast. A specimen radiograph was obtained that showed the clip in seed to be near the center of the specimen. The specimen was  then sent to pathology for further evaluation. Hemostasis was achieved using the Bovie electrocautery. The wound was infiltrated with quarter percent Marcaine and irrigated with saline. The deep layer of the wound was closed with layers of 3-0 Vicryl stitches. The skin was then closed with interrupted 4-0 Monocryl subcuticular stitches. Dermabond dressings were applied. The patient tolerated the procedure well. At the end of the case all needle sponge and instrument counts were correct. The patient was then awakened and taken to recovery in stable condition.  PLAN OF CARE: Discharge to home after PACU  PATIENT DISPOSITION:  PACU - hemodynamically stable.   Delay start of Pharmacological VTE agent (>24hrs) due to surgical blood loss or risk of bleeding: not applicable

## 2015-05-23 NOTE — Interval H&P Note (Signed)
History and Physical Interval Note:  05/23/2015 11:35 AM  Sara Schultz  has presented today for surgery, with the diagnosis of R Breast Lesion  The various methods of treatment have been discussed with the patient and family. After consideration of risks, benefits and other options for treatment, the patient has consented to  Procedure(s): RIGHT BREAST LUMPECTOMY WITH RADIOACTIVE SEED LOCALIZATION (Right) as a surgical intervention .  The patient's history has been reviewed, patient examined, no change in status, stable for surgery.  I have reviewed the patient's chart and labs.  Questions were answered to the patient's satisfaction.     TOTH III,Suleman Gunning S

## 2015-05-24 ENCOUNTER — Encounter (HOSPITAL_BASED_OUTPATIENT_CLINIC_OR_DEPARTMENT_OTHER): Payer: Self-pay | Admitting: General Surgery

## 2015-05-24 NOTE — Anesthesia Postprocedure Evaluation (Signed)
Anesthesia Post Note  Patient: Sara Schultz  Procedure(s) Performed: Procedure(s) (LRB): RIGHT BREAST LUMPECTOMY WITH RADIOACTIVE SEED LOCALIZATION (Right)  Anesthesia type: general  Patient location: PACU  Post pain: Pain level controlled  Post assessment: Patient's Cardiovascular Status Stable  Last Vitals:  Filed Vitals:   05/23/15 1448  BP: 122/77  Pulse: 57  Temp: 36.6 C  Resp: 16    Post vital signs: Reviewed and stable  Level of consciousness: sedated  Complications: No apparent anesthesia complications

## 2015-05-24 NOTE — Progress Notes (Signed)
Patient called [facility] and requested that we contact patient. I contacted patient she reports blurred vision and dilated pupils. She reports that she removed the scopolamine patch this am with unprotected fingers. Spoke to R. Fitzgerald regarding same. He advised that this is probably related to scopolamine, advise patient to wait 1- 2 day to see if she improves, and if no improvement Proofreader. I advised patient of this she verbalized understanding.

## 2015-06-16 ENCOUNTER — Telehealth: Payer: Self-pay | Admitting: *Deleted

## 2015-06-16 NOTE — Telephone Encounter (Signed)
Received referral from [facility].  Called pt and confirmed 06/20/15 high risk appt w/ her.  Unable to mail packet - gave directions, instruction and placed a request for an intake form to be given to the pt at time of check in.  Placed a copy of the notes in Dr. Earmon Phoenix box and took one to HIM to scan.

## 2015-06-20 ENCOUNTER — Ambulatory Visit (HOSPITAL_BASED_OUTPATIENT_CLINIC_OR_DEPARTMENT_OTHER): Payer: BLUE CROSS/BLUE SHIELD | Admitting: Hematology and Oncology

## 2015-06-20 ENCOUNTER — Encounter: Payer: Self-pay | Admitting: Hematology and Oncology

## 2015-06-20 ENCOUNTER — Other Ambulatory Visit: Payer: Self-pay | Admitting: Family Medicine

## 2015-06-20 VITALS — BP 120/74 | HR 96 | Temp 97.5°F | Resp 18 | Ht 67.0 in | Wt 138.6 lb

## 2015-06-20 DIAGNOSIS — N6021 Fibroadenosis of right breast: Secondary | ICD-10-CM | POA: Diagnosis not present

## 2015-06-20 DIAGNOSIS — F418 Other specified anxiety disorders: Secondary | ICD-10-CM

## 2015-06-20 DIAGNOSIS — G4700 Insomnia, unspecified: Secondary | ICD-10-CM

## 2015-06-20 NOTE — Assessment & Plan Note (Addendum)
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 Was a lesion on radial scar are benign changes in the breasts. They are described hardened area of the breast glandular tissue which is replaced with fibrosis. Radial scar and complex sclerosing lesions needing the same thing with complex sclerosing lesion being bigger than 1 cm in size.  Prognosis: The presence of these lesions does not increase the risk of breast cancer. Her risk of breast cancer is primarily determined by her general risk factors which include age of menarche, number of children, family history, estrogen exposure duration, if further other abnormalities in the breast were detected like atypical hyperplasia or DCIS  Recommendation: There is no recommendation for risk reduction therapies like tamoxifen or raloxifene. 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 I do not believe there is any relationship between the translocation and risk of breast cancer  Follow-up as needed

## 2015-06-20 NOTE — Telephone Encounter (Signed)
Received a refill request for Alprazolam. Last refilled on 03/28/15 for #20 with 0 refills. Last office visit was 12/28/14. Is it okay to refill this medication?

## 2015-06-20 NOTE — Telephone Encounter (Signed)
Rx faxed to pharmacy and patient notified.

## 2015-06-20 NOTE — Progress Notes (Signed)
New pt intake form rcvd.    Reviewed by Dr. Gudena.  Chart updated.  Sent to scan.  

## 2015-06-20 NOTE — Addendum Note (Signed)
Addended by: Lorri Frederick on: 06/20/2015 02:53 PM   Modules accepted: Medications

## 2015-06-20 NOTE — Addendum Note (Signed)
Addended by: Lorri Frederick on: 06/20/2015 04:27 PM   Modules accepted: Medications

## 2015-06-20 NOTE — Progress Notes (Signed)
[facility] CONSULT NOTE  Patient Care Team: Dianne Dun, MD as PCP - General (Family Medicine)  CHIEF COMPLAINTS/PURPOSE OF CONSULTATION:  High-risk breast clinic evaluation  HISTORY OF PRESENTING ILLNESS:  Sara Schultz 44 y.o. female is here because of recent diagnosis of right breast complex sclerosing lesion. She routine screening mammogram which detected abnormality. This was further evaluated by ultrasound and ultrasound-guided biopsy was performed. This suggested a radial scar/complex sclerosing lesion. She underwent right lumpectomy by Dr. [name] which revealed sclerosing adenosis/complex sclerosing lesion. She was sent was for discussion regarding her risk of breast cancer. She reports to be doing very well from surgery standpoint. She does not have any further pain or discomfort.  I reviewed her records extensively and collaborated the history with the patient.  In terms of breast cancer risk profile:  She menarched at early age of 51  She had no pregnancies. Multiple miscarriages. Infertility issues. 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  She was exposed to fertility medications She has no family history of Breast/GYN/GI cancer  MEDICAL HISTORY:  Past Medical History  Diagnosis Date  . Allergic rhinitis   . Depression   . Hyperlipidemia     SURGICAL HISTORY: Past Surgical History  Procedure Laterality Date  . Wisdom tooth extraction    . Ivf      Egg retrieval Jan 2011  . Breast lumpectomy with radioactive seed localization Right 05/23/2015    Procedure: RIGHT BREAST LUMPECTOMY WITH RADIOACTIVE SEED LOCALIZATION;  Surgeon: Chevis Pretty III, MD;  Location: [facility];  Service: General;  Laterality: Right;    SOCIAL HISTORY: Social History   Social History  . Marital Status: Married    Spouse Name: N/A  . Number of Children: N  . Years of  Education: N/A   Occupational History  . Insurance claims handler    Social History Main Topics  . Smoking status: Never Smoker   . Smokeless tobacco: Never Used  . Alcohol Use: No  . Drug Use: No  . Sexual Activity: Yes    Birth Control/ Protection: Pill   Other Topics Concern  . Not on file   Social History Narrative    FAMILY HISTORY: Family History  Problem Relation Age of Onset  . Heart disease Father   . Diabetes Father   . Throat cancer Maternal Grandfather   . Multiple sclerosis Mother   . Lupus Mother     ALLERGIES:  is allergic to penicillins.  MEDICATIONS:  Current Outpatient Prescriptions  Medication Sig Dispense Refill  . ALPRAZolam (XANAX) 0.25 MG tablet TAKE 1 TABLET TWICE A DAY AS NEEDED FOR ANXIETY 20 tablet 0  . doxylamine, Sleep, (UNISOM) 25 MG tablet Take 25 mg by mouth at bedtime as needed.    . fluticasone (FLONASE) 50 MCG/ACT nasal spray Place 1 spray into the nose daily. 16 g 6  . ketotifen (ZADITOR) 0.025 % ophthalmic solution Place 1 drop into both eyes 2 (two) times daily.    Marland Kitchen levonorgestrel (MIRENA) 20 MCG/24HR IUD 1 each by Intrauterine route once.    . Multiple Vitamin (MULTIVITAMIN) tablet Take 1 tablet by mouth daily.    Marland Kitchen oxyCODONE-acetaminophen (ROXICET) 5-325 MG per tablet Take 1-2 tablets by mouth every 4 (four) hours as needed. 50 tablet 0  . valACYclovir (VALTREX) 500 MG tablet Take 500 mg by mouth daily.       No current facility-administered medications  for this visit.    REVIEW OF SYSTEMS:   Constitutional: Denies fevers, chills or abnormal night sweats Eyes: Denies blurriness of vision, double vision or watery eyes Ears, nose, mouth, throat, and face: Denies mucositis or sore throat Respiratory: Denies cough, dyspnea or wheezes Cardiovascular: Denies palpitation, chest discomfort or lower extremity swelling Gastrointestinal:  Denies nausea, heartburn or change in bowel habits Skin: Denies abnormal skin rashes Lymphatics: Denies  new lymphadenopathy or easy bruising Neurological:Denies numbness, tingling or new weaknesses Behavioral/Psych: Mood is stable, no new changes  Breast:  Denies any palpable lumps or discharge All other systems were reviewed with the patient and are negative.  PHYSICAL EXAMINATION: ECOG PERFORMANCE STATUS: 0 - Asymptomatic  Filed Vitals:   06/20/15 1253  BP: 120/74  Pulse: 96  Temp: 97.5 F (36.4 C)  Resp: 18   Filed Weights   06/20/15 1253  Weight: 138 lb 9.6 oz (62.869 kg)    GENERAL:alert, no distress and comfortable SKIN: skin color, texture, turgor are normal, no rashes or significant lesions EYES: normal, conjunctiva are pink and non-injected, sclera clear OROPHARYNX:no exudate, no erythema and lips, buccal mucosa, and tongue normal  NECK: supple, thyroid normal size, non-tender, without nodularity LYMPH:  no palpable lymphadenopathy in the cervical, axillary or inguinal LUNGS: clear to auscultation and percussion with normal breathing effort HEART: regular rate & rhythm and no murmurs and no lower extremity edema ABDOMEN:abdomen soft, non-tender and normal bowel sounds Musculoskeletal:no cyanosis of digits and no clubbing  PSYCH: alert & oriented x 3 with fluent speech NEURO: no focal motor/sensory deficits  LABORATORY DATA:  I have reviewed the data as listed Lab Results  Component Value Date   WBC 7.6 06/17/2014   HGB 14.5 05/23/2015   HCT 43.2 06/17/2014   MCV 93.3 06/17/2014   PLT 189.0 06/17/2014   Lab Results  Component Value Date   NA 137 06/17/2014   K 4.0 06/17/2014   CL 103 06/17/2014   CO2 27 06/17/2014    ASSESSMENT AND PLAN:  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 Was a lesion on radial scar are benign changes in the breasts. They are described hardened area of the breast  glandular tissue which is replaced with fibrosis. Radial scar and complex sclerosing lesions needing the same thing with complex sclerosing lesion being bigger than 1 cm in size.  Prognosis: The presence of these lesions does not increase the risk of breast cancer. Her risk of breast cancer is primarily determined by her general risk factors which include age of menarche, number of children, family history, estrogen exposure duration, if further other abnormalities in the breast were detected like atypical hyperplasia or DCIS. Based on American Cancer Society breast cancer risk assessment tool, her lifetime risk of breast cancer is 13.2% as opposed to 12.2% for an average woman. Her 5 year risk of breast cancer is 1.2% as opposed to 0.7% for an average woman.   Recommendation: There is no recommendation for risk reduction therapies like tamoxifen or raloxifene.  All questions were answered. The patient knows to call the clinic with any problems, questions or concerns.    Sabas Sous, MD 1:23 PM

## 2015-10-12 ENCOUNTER — Other Ambulatory Visit: Payer: Self-pay | Admitting: *Deleted

## 2015-10-12 NOTE — Telephone Encounter (Signed)
Last f/u 12/2014. Pt has not been on medication since 06/2015. pls advise

## 2015-10-12 NOTE — Telephone Encounter (Signed)
Ok to refill one time only.  Needs OV for further refills. 

## 2015-10-12 NOTE — Telephone Encounter (Signed)
Lm on pts vm informing her OV is required. Awaiting a call back before calling in meds

## 2015-10-13 MED ORDER — ALPRAZOLAM 0.25 MG PO TABS: 0.2500 mg | ORAL_TABLET | Freq: Two times a day (BID) | ORAL | Status: DC | PRN

## 2015-10-13 NOTE — Telephone Encounter (Signed)
Cpx3/28 Labs 3/24 Pt aware  Please close

## 2015-10-13 NOTE — Telephone Encounter (Signed)
Lm on pts vm requesting a call back 

## 2015-10-13 NOTE — Telephone Encounter (Signed)
Attempted to contact pt, vm now full. Ok for pt to have CPE in 12/2015, but a follow up will be required before that time. rx called in to pharmacy. Pt previously advised after current refill, additional will not be granted until seen in office

## 2015-12-21 ENCOUNTER — Other Ambulatory Visit: Payer: Self-pay | Admitting: Internal Medicine

## 2015-12-21 DIAGNOSIS — Z0000 Encounter for general adult medical examination without abnormal findings: Secondary | ICD-10-CM

## 2015-12-28 ENCOUNTER — Telehealth: Payer: Self-pay | Admitting: Family Medicine

## 2015-12-28 NOTE — Telephone Encounter (Signed)
Yes ok to add Vitamin D.

## 2015-12-28 NOTE — Telephone Encounter (Signed)
Patient is coming in for lab work on Friday for her physical.  Patient wanted to know if she can have her Vit D checked.  Patient said she's been having fatigue and she doesn't take vitamins.

## 2015-12-30 ENCOUNTER — Other Ambulatory Visit (INDEPENDENT_AMBULATORY_CARE_PROVIDER_SITE_OTHER): Payer: 59

## 2015-12-30 DIAGNOSIS — Z0000 Encounter for general adult medical examination without abnormal findings: Secondary | ICD-10-CM

## 2015-12-30 DIAGNOSIS — R5383 Other fatigue: Secondary | ICD-10-CM

## 2015-12-30 LAB — COMPREHENSIVE METABOLIC PANEL
ALK PHOS: 42 U/L (ref 39–117)
ALT: 10 U/L (ref 0–35)
AST: 15 U/L (ref 0–37)
Albumin: 4.3 g/dL (ref 3.5–5.2)
BUN: 12 mg/dL (ref 6–23)
CALCIUM: 9.4 mg/dL (ref 8.4–10.5)
CHLORIDE: 104 meq/L (ref 96–112)
CO2: 30 meq/L (ref 19–32)
Creatinine, Ser: 0.97 mg/dL (ref 0.40–1.20)
GFR: 66.66 mL/min (ref >60.00)
GLUCOSE: 92 mg/dL (ref 70–99)
POTASSIUM: 4.1 meq/L (ref 3.5–5.1)
SODIUM: 139 meq/L (ref 135–145)
Total Bilirubin: 0.7 mg/dL (ref 0.2–1.2)
Total Protein: 7.1 g/dL (ref 6.0–8.3)

## 2015-12-30 LAB — CBC WITH DIFFERENTIAL/PLATELET
BASOS PCT: 0.4 % (ref 0.0–3.0)
Basophils Absolute: 0 10*3/uL (ref 0.0–0.1)
EOS ABS: 0 10*3/uL (ref 0.0–0.7)
Eosinophils Relative: 0.3 % (ref 0.0–5.0)
HCT: 41.5 % (ref 36.0–46.0)
Hemoglobin: 13.8 g/dL (ref 12.0–15.0)
Lymphocytes Relative: 18.6 % (ref 12.0–46.0)
Lymphs Abs: 1.3 10*3/uL (ref 0.7–4.0)
MCHC: 33.3 g/dL (ref 30.0–36.0)
MCV: 92.3 fl (ref 78.0–100.0)
MONO ABS: 0.5 10*3/uL (ref 0.1–1.0)
Monocytes Relative: 6.9 % (ref 3.0–12.0)
NEUTROS ABS: 5.3 10*3/uL (ref 1.4–7.7)
Neutrophils Relative %: 73.8 % (ref 43.0–77.0)
PLATELETS: 192 10*3/uL (ref 150.0–400.0)
RBC: 4.5 Mil/uL (ref 3.87–5.11)
RDW: 13 % (ref 11.5–15.5)
WBC: 7.2 10*3/uL (ref 4.0–10.5)

## 2015-12-30 LAB — VITAMIN D 25 HYDROXY (VIT D DEFICIENCY, FRACTURES): VITD: 28.75 ng/mL — ABNORMAL LOW (ref 30.00–100.00)

## 2015-12-30 LAB — HIV ANTIBODY (ROUTINE TESTING W REFLEX): HIV 1&2 Ab, 4th Generation: NONREACTIVE

## 2015-12-30 LAB — LIPID PANEL
CHOL/HDL RATIO: 3
Cholesterol: 167 mg/dL (ref 0–200)
HDL: 57.5 mg/dL (ref >39.00)
LDL Cholesterol: 98 mg/dL (ref 0–99)
NonHDL: 109.98
TRIGLYCERIDES: 61 mg/dL (ref 0.0–149.0)
VLDL: 12.2 mg/dL (ref 0.0–40.0)

## 2015-12-30 LAB — TSH: TSH: 1.19 u[IU]/mL (ref 0.35–4.50)

## 2015-12-30 NOTE — Telephone Encounter (Signed)
Vit d added

## 2015-12-30 NOTE — Addendum Note (Signed)
Addended by: Baldomero LamyHAVERS, NATASHA C on: 12/30/2015 09:52 AM   Modules accepted: Orders

## 2016-01-03 ENCOUNTER — Encounter: Payer: Self-pay | Admitting: Family Medicine

## 2016-01-03 ENCOUNTER — Ambulatory Visit (INDEPENDENT_AMBULATORY_CARE_PROVIDER_SITE_OTHER): Payer: 59 | Admitting: Family Medicine

## 2016-01-03 VITALS — BP 112/60 | HR 92 | Temp 98.3°F | Ht 66.75 in | Wt 135.5 lb

## 2016-01-03 DIAGNOSIS — Z0000 Encounter for general adult medical examination without abnormal findings: Secondary | ICD-10-CM | POA: Diagnosis not present

## 2016-01-03 DIAGNOSIS — F418 Other specified anxiety disorders: Secondary | ICD-10-CM

## 2016-01-03 DIAGNOSIS — Z01419 Encounter for gynecological examination (general) (routine) without abnormal findings: Secondary | ICD-10-CM

## 2016-01-03 DIAGNOSIS — G4700 Insomnia, unspecified: Secondary | ICD-10-CM

## 2016-01-03 MED ORDER — ALPRAZOLAM 0.25 MG PO TABS: 0.2500 mg | ORAL_TABLET | Freq: Two times a day (BID) | ORAL | Status: DC | PRN

## 2016-01-03 NOTE — Addendum Note (Signed)
Addended by: Dianne DunARON, Theresea Trautmann M on: 01/03/2016 09:11 AM   Modules accepted: Orders, SmartSet

## 2016-01-03 NOTE — Progress Notes (Signed)
Pre visit review using our clinic review tool, if applicable. No additional management support is needed unless otherwise documented below in the visit note. 

## 2016-01-03 NOTE — Patient Instructions (Signed)
Great to see you. Please let me know if I can help in any way with the job search.

## 2016-01-03 NOTE — Assessment & Plan Note (Signed)
Stable with current rx. I did refill xanax and give it to pt today.

## 2016-01-03 NOTE — Assessment & Plan Note (Signed)
She feels mostly situational and does not want to try another rx or referral at this time.

## 2016-01-03 NOTE — Progress Notes (Signed)
Subjective:   Patient ID: Sara Schultz, female    DOB: Aug 05, 1971, 45 y.o.   MRN: [id number]  Sara Schultz is a pleasant 45 y.o. year old female who presents to clinic today with Annual Exam; Anxiety; and Insomnia  on 01/03/2016  HPI:  H/o right breast complex sclerosing lesions- s/p excision (Dr. Carolynne Edouardoth) in 05/2015. Mammogram 04/2015  Has IUD  Depression/anxiety- chronic issue.   Divorce finalized which is good but her new job is her current stressor.  Does not like her new job- gets home and feels "depressed."   Has been on and off medications for years. SSRIs (including prozac), was also on amitriptyline with GI side effects.   In 2014, tried Wellbutrin XL- had worsening issues with insomnia so we tried short acting twice daily dosing but had the same issue.  Tried zoloft last year- weaned herself off- GI and "zombie like" side effects.  Trazodone was also not effective. Unisom and melatonin are currently what she takes for insomnia and has been the most effective.  Denies SI or HI.  Very supportive family.  Lab Results  Component Value Date   CHOL 167 12/30/2015   HDL 57.50 12/30/2015   LDLCALC 98 12/30/2015   TRIG 61.0 12/30/2015   CHOLHDL 3 12/30/2015   Lab Results  Component Value Date   ALT 10 12/30/2015   AST 15 12/30/2015   ALKPHOS 42 12/30/2015   BILITOT 0.7 12/30/2015   Lab Results  Component Value Date   TSH 1.19 12/30/2015   Lab Results  Component Value Date   WBC 7.2 12/30/2015   HGB 13.8 12/30/2015   HCT 41.5 12/30/2015   MCV 92.3 12/30/2015   PLT 192.0 12/30/2015    Current Outpatient Prescriptions on File Prior to Visit  Medication Sig Dispense Refill  . doxylamine, Sleep, (UNISOM) 25 MG tablet Take 25 mg by mouth at bedtime as needed.    . fluticasone (FLONASE) 50 MCG/ACT nasal spray Place 1 spray into the nose daily. 16 g 6  . levonorgestrel (MIRENA) 20 MCG/24HR IUD 1 each by Intrauterine route once.    . Multiple Vitamin  (MULTIVITAMIN) tablet Take 1 tablet by mouth daily.    . valACYclovir (VALTREX) 500 MG tablet Take 500 mg by mouth daily.       No current facility-administered medications on file prior to visit.    Allergies  Allergen Reactions  . Penicillins     Past Medical History  Diagnosis Date  . Allergic rhinitis   . Depression   . Hyperlipidemia   . Anxiety   . Genital herpes     Past Surgical History  Procedure Laterality Date  . Wisdom tooth extraction    . Ivf      Egg retrieval Jan 2011  . Breast lumpectomy with radioactive seed localization Right 05/23/2015    Procedure: RIGHT BREAST LUMPECTOMY WITH RADIOACTIVE SEED LOCALIZATION;  Surgeon: Chevis PrettyPaul Toth III, MD;  Location: [facility];  Service: General;  Laterality: Right;    Family History  Problem Relation Age of Onset  . Heart disease Father   . Diabetes Father   . Throat cancer Maternal Grandfather   . Multiple sclerosis Mother   . Lupus Mother     Social History   Social History  . Marital Status: Married    Spouse Name: N/A  . Number of Children: N  . Years of Education: N/A   Occupational History  . Insurance claims handlertechnical advisor    Social History Main  Topics  . Smoking status: Never Smoker   . Smokeless tobacco: Never Used  . Alcohol Use: 0.6 oz/week    1 Standard drinks or equivalent per week  . Drug Use: No  . Sexual Activity: Yes    Birth Control/ Protection: Pill   Other Topics Concern  . Not on file   Social History Narrative   Pt is separated.  Pt is unemployed.   The PMH, PSH, Social History, Family History, Medications, and allergies have been reviewed in [facility], and have been updated if relevant.     Review of Systems  Constitutional: Negative for fatigue.  HENT: Negative.   Eyes: Negative.   Respiratory: Negative.   Cardiovascular: Negative.   Gastrointestinal: Negative.   Endocrine: Negative.   Allergic/Immunologic: Negative.   Neurological: Negative.   Hematological: Negative.    Psychiatric/Behavioral: Positive for sleep disturbance, dysphoric mood and decreased concentration. Negative for suicidal ideas, hallucinations, behavioral problems, confusion, self-injury and agitation. The patient is nervous/anxious. The patient is not hyperactive.   All other systems reviewed and are negative.      Objective:    BP 112/60 mmHg  Pulse 92  Temp(Src) 98.3 F (36.8 C) (Oral)  Ht 5' 6.75" (1.695 m)  Wt 135 lb 8 oz (61.462 kg)  BMI 21.39 kg/m2  SpO2 99%   Physical Exam  Constitutional: She is oriented to person, place, and time. She appears well-developed and well-nourished. No distress.  HENT:  Head: Normocephalic.  Eyes: Conjunctivae are normal.  Neck: Normal range of motion.  Cardiovascular: Normal rate.   Pulmonary/Chest: Effort normal.  Musculoskeletal: Normal range of motion. She exhibits no edema.  Neurological: She is alert and oriented to person, place, and time. No cranial nerve deficit.  Skin: Skin is warm and dry.  Psychiatric: She has a normal mood and affect. Her behavior is normal. Judgment and thought content normal.  Nursing note and vitals reviewed.         Assessment & Plan:   Well woman exam  Insomnia  Depression with anxiety No Follow-up on file.

## 2016-04-07 DIAGNOSIS — J069 Acute upper respiratory infection, unspecified: Secondary | ICD-10-CM | POA: Diagnosis not present

## 2016-05-23 DIAGNOSIS — Z86018 Personal history of other benign neoplasm: Secondary | ICD-10-CM | POA: Diagnosis not present

## 2016-05-23 DIAGNOSIS — D225 Melanocytic nevi of trunk: Secondary | ICD-10-CM | POA: Diagnosis not present

## 2016-05-23 DIAGNOSIS — D1800 Hemangioma unspecified site: Secondary | ICD-10-CM | POA: Diagnosis not present

## 2016-08-15 DIAGNOSIS — B373 Candidiasis of vulva and vagina: Secondary | ICD-10-CM | POA: Diagnosis not present

## 2016-08-22 DIAGNOSIS — Z6820 Body mass index (BMI) 20.0-20.9, adult: Secondary | ICD-10-CM | POA: Diagnosis not present

## 2016-08-22 DIAGNOSIS — Z23 Encounter for immunization: Secondary | ICD-10-CM | POA: Diagnosis not present

## 2016-08-22 DIAGNOSIS — Z01419 Encounter for gynecological examination (general) (routine) without abnormal findings: Secondary | ICD-10-CM | POA: Diagnosis not present

## 2016-10-12 IMAGING — US US BREAST LTD UNI RIGHT INC AXILLA
1 series · 9 of 9 positions shown · non-contrast
Comparison: 03/08/2015, 02/25/2014.

CLINICAL DATA: Recall from screening mammogram.

EXAM:
DIGITAL DIAGNOSTIC RIGHT MAMMOGRAM WITH 3D TOMOSYNTHESIS
ULTRASOUND RIGHT BREAST

[Series 1: us breast ltd uni right inc axilla · 0.06mm/px · 9 of 9 slices shown]
[im 1/9]
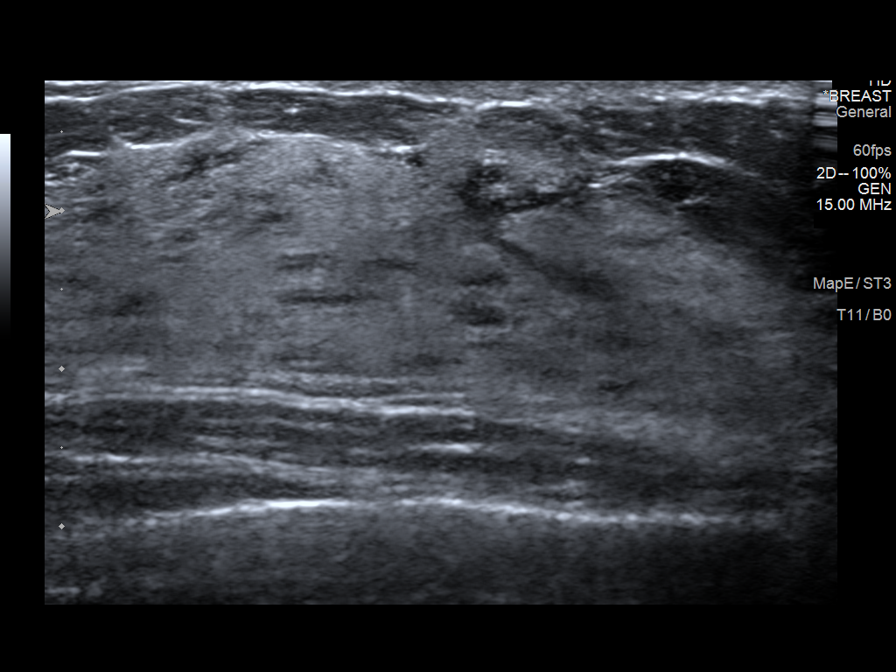
[im 2/9]
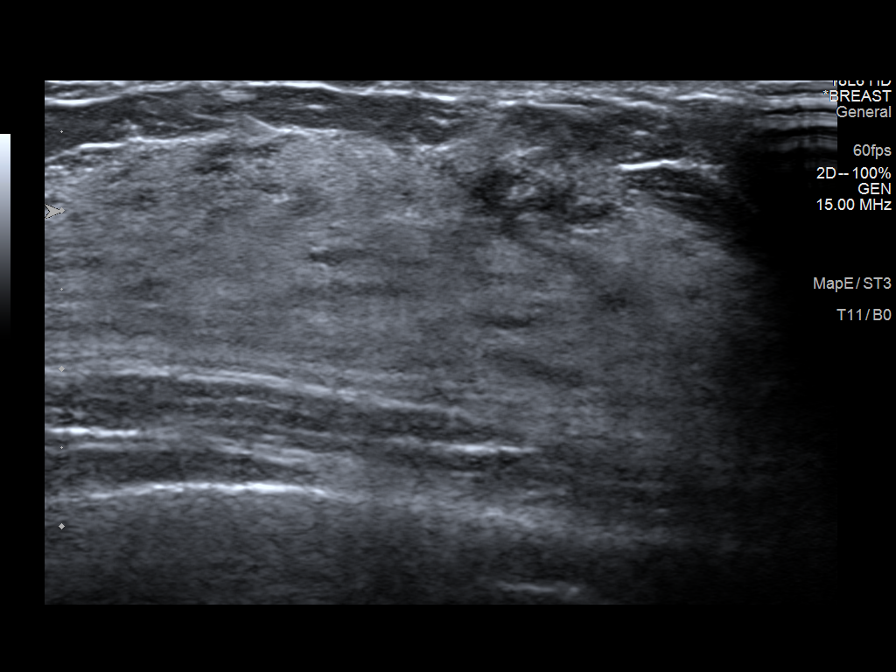
[im 3/9]
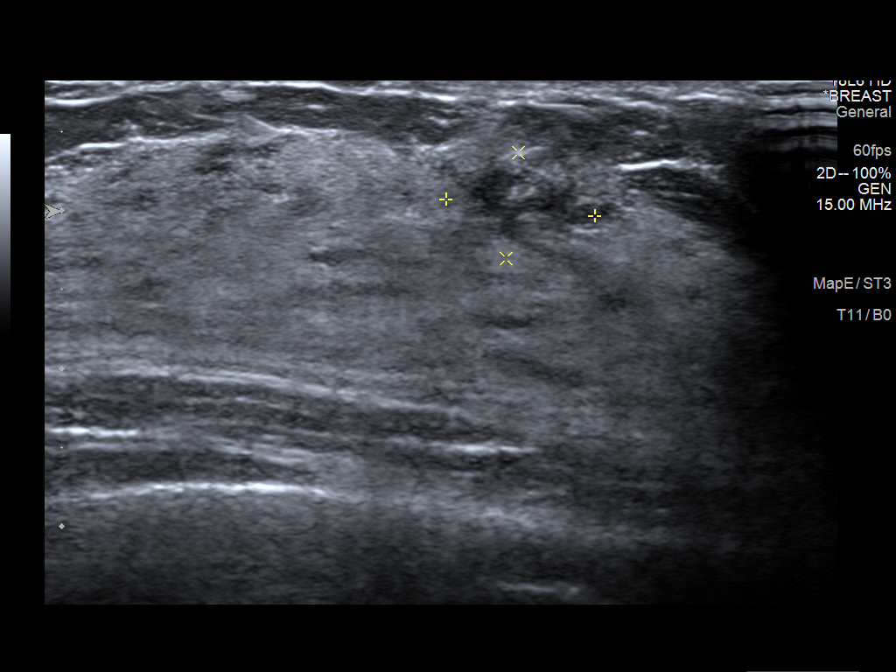
[im 4/9]
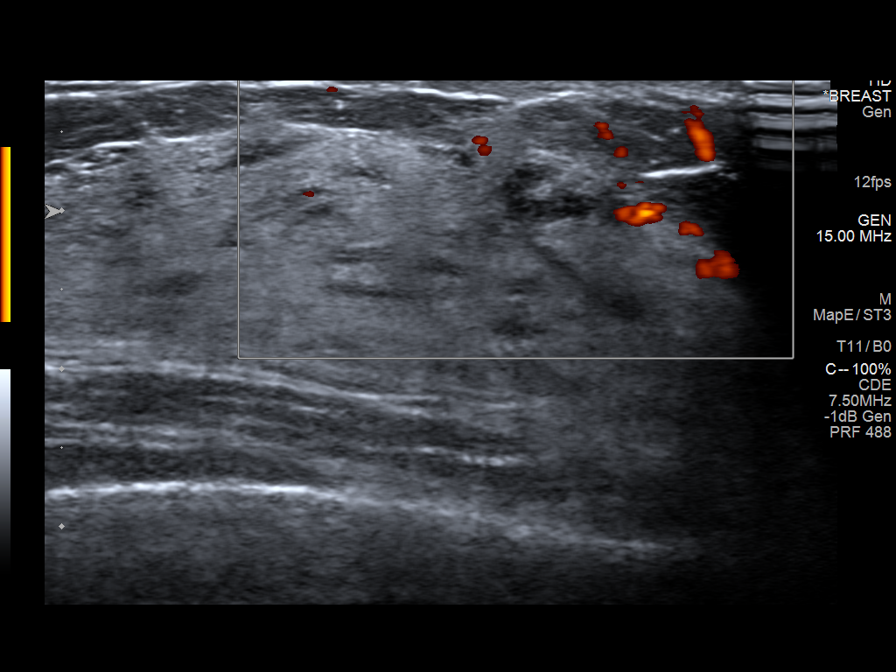
[im 5/9]
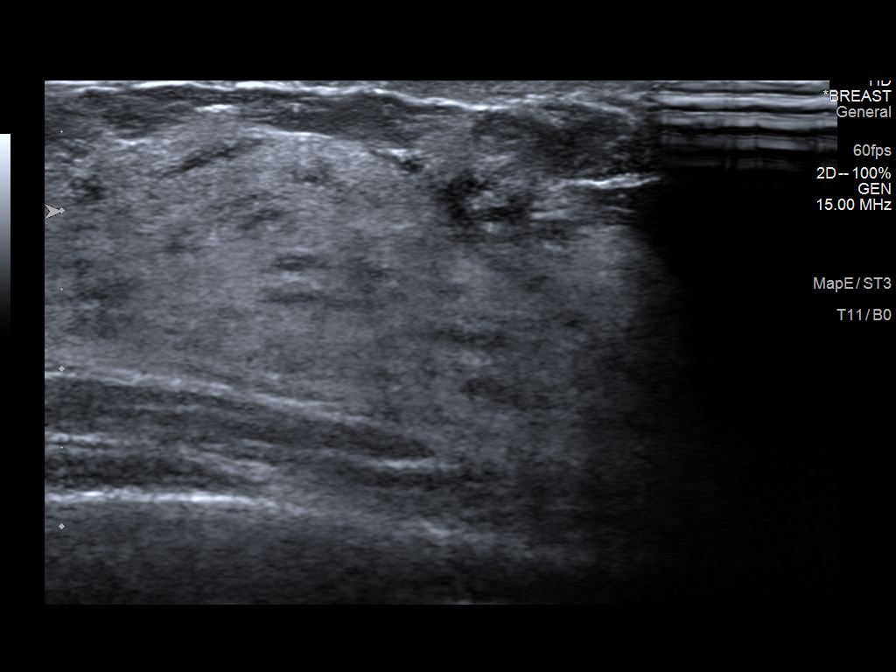
[im 6/9]
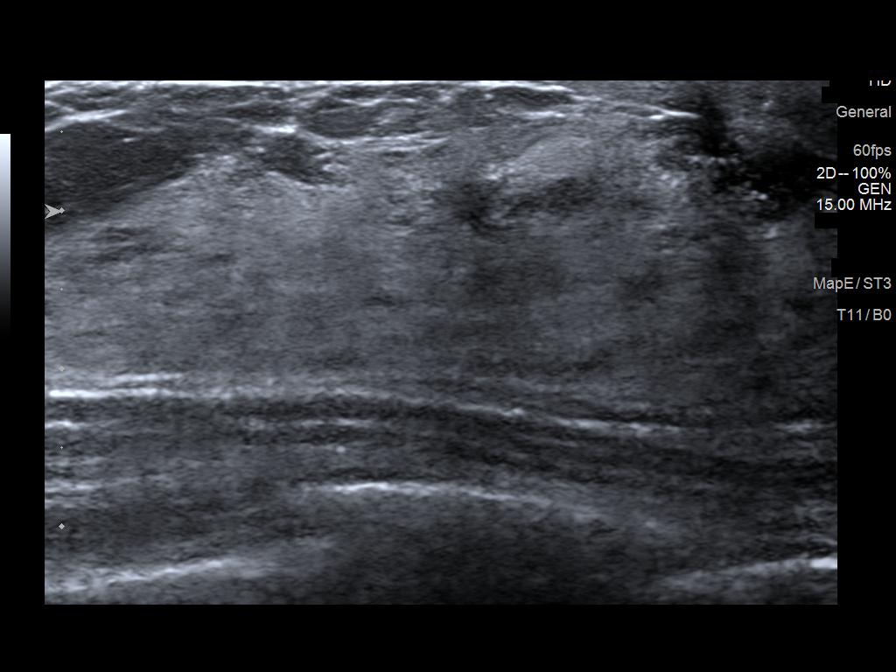
[im 7/9]
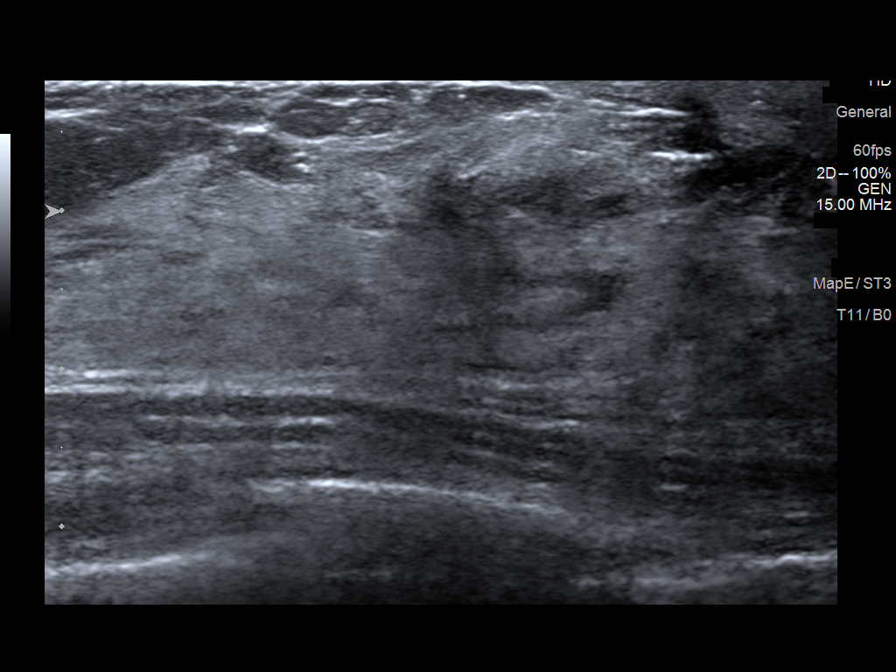
[im 8/9]
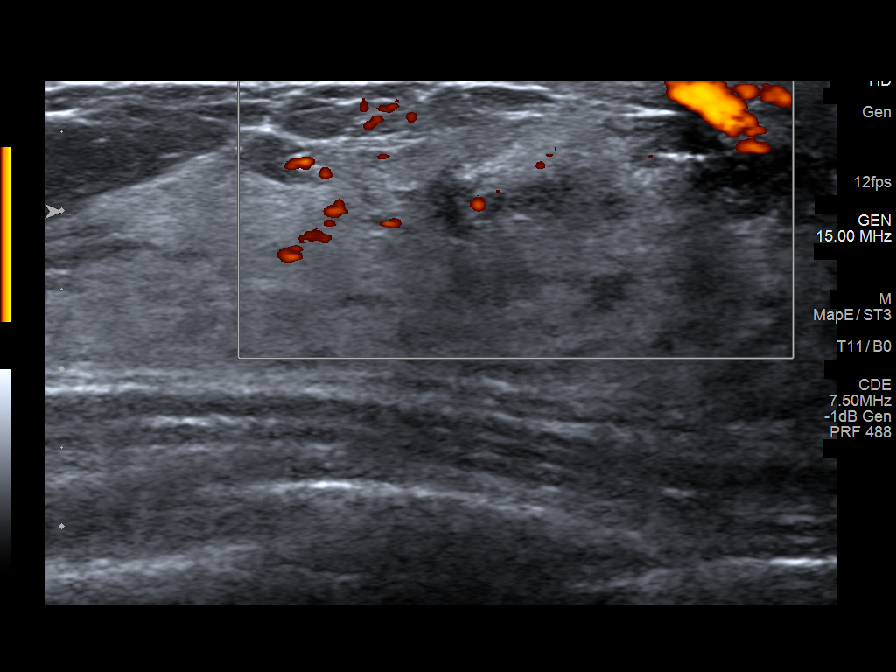
[im 9/9]
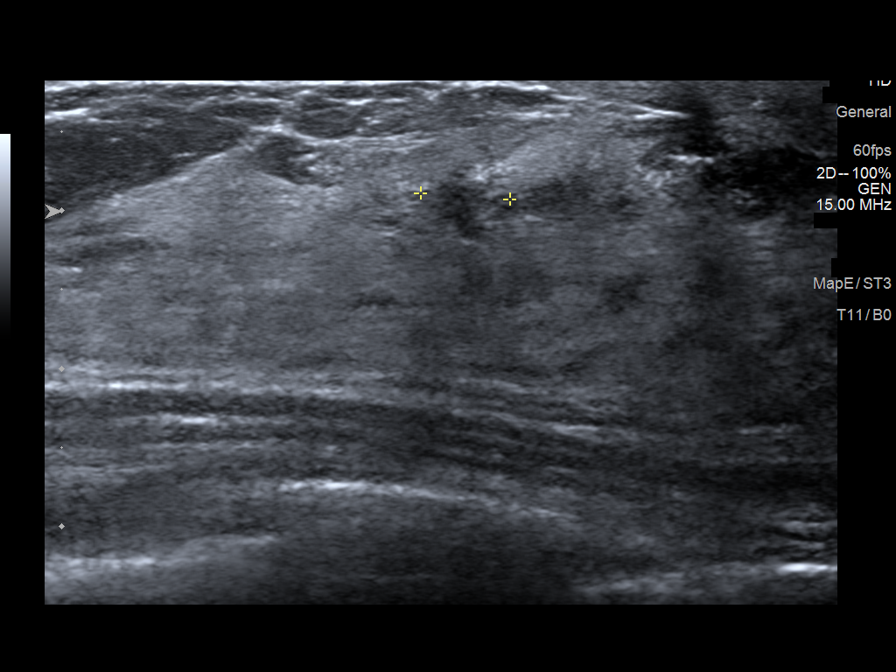

[9 of 9 positions shown; findings below may reference images not displayed]

ACR Breast Density Category d: The breast tissue is extremely dense,
which lowers the sensitivity of mammography.
FINDINGS: There is a persistent subtle area of distortion located within the
subareolar right breast at 12:30 o'clock position.

On physical exam, there is no discrete palpable abnormality in the
area of mammographic concern.

Targeted ultrasound is performed, showing a subtle hypoechoic area
with probable distortion located in the area of mammographic
concern. However, the abnormality is better visualized by 3D
mammography and tissue sampling via tomosynthesis technique is
recommended.

Ultrasound of the right axilla demonstrates normal axillary contents
and no evidence for adenopathy.
IMPRESSION: Persistent subtle area of distortion located within the right breast
at 12:30 o'clock position in a subareolar location. Tissue sampling
via tomosynthesis guided biopsy is recommended and will be
scheduled.

RECOMMENDATION:
Right breast stereotactic ( tomosynthesis) biopsy.

I have discussed the findings and recommendations with the patient.
Results were also provided in writing at the conclusion of the
visit. If applicable, a reminder letter will be sent to the patient
regarding the next appointment.

BI-RADS CATEGORY  4: Suspicious.

## 2017-02-19 ENCOUNTER — Encounter: Payer: Self-pay | Admitting: Family Medicine

## 2017-03-27 DIAGNOSIS — Z01419 Encounter for gynecological examination (general) (routine) without abnormal findings: Secondary | ICD-10-CM | POA: Diagnosis not present

## 2017-03-27 DIAGNOSIS — Z1231 Encounter for screening mammogram for malignant neoplasm of breast: Secondary | ICD-10-CM | POA: Diagnosis not present

## 2017-03-27 DIAGNOSIS — Z6821 Body mass index (BMI) 21.0-21.9, adult: Secondary | ICD-10-CM | POA: Diagnosis not present

## 2017-05-29 DIAGNOSIS — L814 Other melanin hyperpigmentation: Secondary | ICD-10-CM | POA: Diagnosis not present

## 2017-05-29 DIAGNOSIS — D1800 Hemangioma unspecified site: Secondary | ICD-10-CM | POA: Diagnosis not present

## 2017-05-29 DIAGNOSIS — Z86018 Personal history of other benign neoplasm: Secondary | ICD-10-CM | POA: Diagnosis not present

## 2017-05-29 DIAGNOSIS — D225 Melanocytic nevi of trunk: Secondary | ICD-10-CM | POA: Diagnosis not present

## 2017-07-19 ENCOUNTER — Encounter: Payer: Self-pay | Admitting: Family Medicine

## 2017-08-01 NOTE — Telephone Encounter (Signed)
Copied from CRM #[id number]. Topic: General - Other >> Aug 01, 2017 11:34 AM Viviann SpareWhite, Selina wrote: Reason for CRM: Pt is requesting lab work. I do not see an appt to schedule that appt. Pls. Contact pt with appt.  Is this a CPX lab appt?

## 2017-08-14 DIAGNOSIS — Z30433 Encounter for removal and reinsertion of intrauterine contraceptive device: Secondary | ICD-10-CM | POA: Diagnosis not present

## 2017-08-14 DIAGNOSIS — Z23 Encounter for immunization: Secondary | ICD-10-CM | POA: Diagnosis not present

## 2017-08-19 ENCOUNTER — Other Ambulatory Visit: Payer: Self-pay | Admitting: Family Medicine

## 2017-08-19 ENCOUNTER — Other Ambulatory Visit (INDEPENDENT_AMBULATORY_CARE_PROVIDER_SITE_OTHER): Payer: BLUE CROSS/BLUE SHIELD

## 2017-08-19 ENCOUNTER — Other Ambulatory Visit: Payer: BLUE CROSS/BLUE SHIELD

## 2017-08-19 DIAGNOSIS — Z01419 Encounter for gynecological examination (general) (routine) without abnormal findings: Secondary | ICD-10-CM | POA: Diagnosis not present

## 2017-08-19 NOTE — Addendum Note (Signed)
Addended by: Adrienne Trombetta J on: 08/19/2017 08:28 AM   Modules accepted: Orders  

## 2017-08-19 NOTE — Addendum Note (Signed)
Addended by: Alvina ChouWALSH, TERRI J on: 08/19/2017 08:28 AM   Modules accepted: Orders

## 2017-08-19 NOTE — Addendum Note (Signed)
Addended by: Larrisa Cravey J on: 08/19/2017 08:28 AM   Modules accepted: Orders  

## 2017-08-19 NOTE — Addendum Note (Signed)
Addended by: Sheenah Dimitroff J on: 08/19/2017 08:28 AM   Modules accepted: Orders  

## 2017-08-20 ENCOUNTER — Encounter: Payer: Self-pay | Admitting: Family Medicine

## 2017-08-20 ENCOUNTER — Ambulatory Visit (INDEPENDENT_AMBULATORY_CARE_PROVIDER_SITE_OTHER): Payer: BLUE CROSS/BLUE SHIELD | Admitting: Family Medicine

## 2017-08-20 VITALS — BP 112/68 | HR 105 | Temp 98.4°F | Ht 67.0 in | Wt 135.8 lb

## 2017-08-20 DIAGNOSIS — Z23 Encounter for immunization: Secondary | ICD-10-CM

## 2017-08-20 DIAGNOSIS — F418 Other specified anxiety disorders: Secondary | ICD-10-CM | POA: Diagnosis not present

## 2017-08-20 DIAGNOSIS — Z0000 Encounter for general adult medical examination without abnormal findings: Secondary | ICD-10-CM | POA: Diagnosis not present

## 2017-08-20 DIAGNOSIS — G4700 Insomnia, unspecified: Secondary | ICD-10-CM | POA: Diagnosis not present

## 2017-08-20 DIAGNOSIS — Z01419 Encounter for gynecological examination (general) (routine) without abnormal findings: Secondary | ICD-10-CM

## 2017-08-20 LAB — CBC WITH DIFFERENTIAL/PLATELET
BASOS ABS: 0 10*3/uL (ref 0.0–0.2)
Basos: 0 %
EOS (ABSOLUTE): 0.1 10*3/uL (ref 0.0–0.4)
Eos: 1 %
HEMOGLOBIN: 13.9 g/dL (ref 11.1–15.9)
Hematocrit: 42.1 % (ref 34.0–46.6)
Immature Grans (Abs): 0 10*3/uL (ref 0.0–0.1)
Immature Granulocytes: 0 %
LYMPHS ABS: 1.7 10*3/uL (ref 0.7–3.1)
Lymphs: 27 %
MCH: 31.6 pg (ref 26.6–33.0)
MCHC: 33 g/dL (ref 31.5–35.7)
MCV: 96 fL (ref 79–97)
MONOCYTES: 10 %
Monocytes Absolute: 0.6 10*3/uL (ref 0.1–0.9)
Neutrophils Absolute: 3.7 10*3/uL (ref 1.4–7.0)
Neutrophils: 62 %
PLATELETS: 184 10*3/uL (ref 150–379)
RBC: 4.4 x10E6/uL (ref 3.77–5.28)
RDW: 13.2 % (ref 12.3–15.4)
WBC: 6 10*3/uL (ref 3.4–10.8)

## 2017-08-20 LAB — LIPID PANEL
CHOLESTEROL TOTAL: 177 mg/dL (ref 100–199)
Chol/HDL Ratio: 2.8 ratio (ref 0.0–4.4)
HDL: 63 mg/dL (ref >39)
LDL CALC: 98 mg/dL (ref 0–99)
TRIGLYCERIDES: 82 mg/dL (ref 0–149)
VLDL CHOLESTEROL CAL: 16 mg/dL (ref 5–40)

## 2017-08-20 LAB — COMPREHENSIVE METABOLIC PANEL
ALBUMIN: 4.3 g/dL (ref 3.5–5.5)
ALK PHOS: 50 IU/L (ref 39–117)
ALT: 9 IU/L (ref 0–32)
AST: 17 IU/L (ref 0–40)
Albumin/Globulin Ratio: 2 (ref 1.2–2.2)
BUN / CREAT RATIO: 21 (ref 9–23)
BUN: 16 mg/dL (ref 6–24)
Bilirubin Total: 0.5 mg/dL (ref 0.0–1.2)
CO2: 24 mmol/L (ref 20–29)
CREATININE: 0.77 mg/dL (ref 0.57–1.00)
Calcium: 8.7 mg/dL (ref 8.7–10.2)
Chloride: 102 mmol/L (ref 96–106)
GFR calc Af Amer: 109 mL/min/{1.73_m2} (ref >59)
GFR calc non Af Amer: 94 mL/min/{1.73_m2} (ref >59)
GLUCOSE: 84 mg/dL (ref 65–99)
Globulin, Total: 2.2 g/dL (ref 1.5–4.5)
Potassium: 4.2 mmol/L (ref 3.5–5.2)
Sodium: 140 mmol/L (ref 134–144)
Total Protein: 6.5 g/dL (ref 6.0–8.5)

## 2017-08-20 LAB — TSH: TSH: 1.27 u[IU]/mL (ref 0.450–4.500)

## 2017-08-20 MED ORDER — ALPRAZOLAM 0.25 MG PO TABS: 0.2500 mg | ORAL_TABLET | Freq: Two times a day (BID) | ORAL | 5 refills | Status: DC | PRN

## 2017-08-20 NOTE — Assessment & Plan Note (Signed)
Feels symptoms are controlled. Xanax rx refilled today.

## 2017-08-20 NOTE — Patient Instructions (Signed)
Great to see you!   

## 2017-08-20 NOTE — Progress Notes (Signed)
Subjective:   Patient ID: Sara Schultz, female    DOB: 06/20/1971, 46 y.o.   MRN: [id number]  Sara Schultz is a pleasant 46 y.o. year old female who presents to clinic today with Annual Exam (Patient is here today for a CPE without PAP.  PAP completed at [facility] mid September and was WNL.  Fasting labs completed yesterday Had Flu shot last week at GYN when they replaced IUD.)  on 08/20/2017  HPI:  Health Maintenance  Topic Date Due  . TETANUS/TDAP  02/19/1992  . PAP SMEAR  06/20/2020  . INFLUENZA VACCINE  Completed  . HIV Screening  Completed    H/o right breast complex sclerosing lesions- s/p excision (Dr. Carolynne Edouardoth) in 05/2015. Mammogram 04/2015 Has GYN- pap smear done in 06/2017. Has IUD- replaced last week.  Depression/anxiety- chronic issue.   Has been on and off medications for years. SSRIs (including prozac), was also on amitriptyline with GI side effects. In 2014, tried Wellbutrin XL- had worsening issues with insomnia so we tried short acting twice daily dosing but had the same issue. Tried zoloft two years ago- weaned herself off- GI and "zombie like" side effects.  Trazodone was also not effective. Unisom and melatonin are currently what she takes for insomnia and has been the most effective.  She is using as needed xanax.  Denies SI or HI.    Lab Results  Component Value Date   CHOL 177 08/19/2017   HDL 63 08/19/2017   LDLCALC 98 08/19/2017   TRIG 82 08/19/2017   CHOLHDL 2.8 08/19/2017   Lab Results  Component Value Date   ALT 9 08/19/2017   AST 17 08/19/2017   ALKPHOS 50 08/19/2017   BILITOT 0.5 08/19/2017   Lab Results  Component Value Date   TSH 1.270 08/19/2017   Lab Results  Component Value Date   WBC 6.0 08/19/2017   HGB 13.9 08/19/2017   HCT 42.1 08/19/2017   MCV 96 08/19/2017   PLT 184 08/19/2017    Current Outpatient Medications on File Prior to Visit  Medication Sig Dispense Refill  . ALPRAZolam (XANAX) 0.25  MG tablet Take 1 tablet (0.25 mg total) by mouth 2 (two) times daily as needed. for anxiety 60 tablet 0  . doxylamine, Sleep, (UNISOM) 25 MG tablet Take 25 mg by mouth at bedtime as needed.    . fluticasone (FLONASE) 50 MCG/ACT nasal spray Place 1 spray into the nose daily. 16 g 6  . levonorgestrel (MIRENA) 20 MCG/24HR IUD 1 each by Intrauterine route once.    . Multiple Vitamin (MULTIVITAMIN) tablet Take 1 tablet by mouth daily.    . valACYclovir (VALTREX) 500 MG tablet Take 500 mg by mouth daily.       No current facility-administered medications on file prior to visit.     Allergies  Allergen Reactions  . Penicillins     Past Medical History:  Diagnosis Date  . Allergic rhinitis   . Anxiety   . Depression   . Genital herpes   . Hyperlipidemia     Past Surgical History:  Procedure Laterality Date  . IVF     Egg retrieval Jan 2011  . WISDOM TOOTH EXTRACTION      Family History  Problem Relation Age of Onset  . Heart disease Father   . Diabetes Father   . Throat cancer Maternal Grandfather   . Multiple sclerosis Mother   . Lupus Mother     Social History   Socioeconomic  History  . Marital status: Married    Spouse name: Not on file  . Number of children: N  . Years of education: Not on file  . Highest education level: Not on file  Social Needs  . Financial resource strain: Not on file  . Food insecurity - worry: Not on file  . Food insecurity - inability: Not on file  . Transportation needs - medical: Not on file  . Transportation needs - non-medical: Not on file  Occupational History  . Occupation: Insurance claims handlertechnical advisor  Tobacco Use  . Smoking status: Never Smoker  . Smokeless tobacco: Never Used  Substance and Sexual Activity  . Alcohol use: Yes    Alcohol/week: 0.6 oz    Types: 1 Standard drinks or equivalent per week  . Drug use: No  . Sexual activity: Yes    Birth control/protection: Pill  Other Topics Concern  . Not on file  Social History  Narrative   Pt is separated.  Pt is unemployed.   The PMH, PSH, Social History, Family History, Medications, and allergies [facility], and have been updated if relevant.     Review of Systems  Constitutional: Negative for fatigue.  HENT: Negative.   Eyes: Negative.   Respiratory: Negative.   Cardiovascular: Negative.   Gastrointestinal: Negative.   Endocrine: Negative.   Allergic/Immunologic: Negative.   Neurological: Negative.   Hematological: Negative.   Psychiatric/Behavioral: Positive for sleep disturbance. Negative for agitation, behavioral problems, confusion, decreased concentration, dysphoric mood, hallucinations, self-injury and suicidal ideas. The patient is not nervous/anxious and is not hyperactive.   All other systems reviewed and are negative.      Objective:    BP 112/68 (BP Location: Left Arm, Patient Position: Sitting, Cuff Size: Normal)   Pulse (!) 105   Temp 98.4 F (36.9 C) (Oral)   Ht 5\' 7"  (1.702 m)   Wt 135 lb 12.8 oz (61.6 kg)   SpO2 99%   BMI 21.27 kg/m    Physical Exam  Constitutional: She is oriented to person, place, and time. She appears well-developed and well-nourished. No distress.  HENT:  Head: Normocephalic and atraumatic.  Right Ear: External ear normal.  Left Ear: External ear normal.  Eyes: Conjunctivae are normal.  Neck: Normal range of motion.  Cardiovascular: Normal rate.  Pulmonary/Chest: Effort normal and breath sounds normal. No respiratory distress. She has no wheezes. She has no rales. She exhibits no tenderness.  Abdominal: Soft. Bowel sounds are normal. She exhibits no distension and no mass. There is no tenderness. There is no rebound and no guarding.  Musculoskeletal: Normal range of motion. She exhibits no edema.  Neurological: She is alert and oriented to person, place, and time. No cranial nerve deficit.  Skin: Skin is warm and dry.  Psychiatric: She has a normal mood and affect. Her behavior is  normal. Judgment and thought content normal.  Nursing note and vitals reviewed.         Assessment & Plan:   Well woman exam  Depression with anxiety  Insomnia, unspecified type No Follow-up on file.

## 2017-08-20 NOTE — Assessment & Plan Note (Signed)
Reviewed preventive care protocols, scheduled due services, and updated immunizations Discussed nutrition, exercise, diet, and healthy lifestyle.  

## 2017-08-21 ENCOUNTER — Encounter: Payer: 59 | Admitting: Family Medicine

## 2017-09-17 ENCOUNTER — Encounter: Payer: Self-pay | Admitting: Family Medicine

## 2017-09-23 ENCOUNTER — Ambulatory Visit: Payer: Self-pay | Admitting: *Deleted

## 2017-09-23 NOTE — Telephone Encounter (Signed)
  Reason for Disposition . Shoulder pain is a chronic symptom (recurrent or ongoing AND present > 4 weeks)  Answer Assessment - Initial Assessment Questions 1. ONSET: "When did the pain start?"     Ache/pain several months and getting worse 2. LOCATION: "Where is the pain located?"     Rt side of neck and shoulder, top of biceps and forearm and sometimes tingling. 3. PAIN: "How bad is the pain?" (Scale 1-10; or mild, moderate, severe)   - MILD (1-3): doesn't interfere with normal activities   - MODERATE (4-7): interferes with normal activities (e.g., work or school) or awakens from sleep   - SEVERE (8-10): excruciating pain, unable to do any normal activities, unable to move arm at all due to pain     moderate 4. WORK OR EXERCISE: "Has there been any recent work or exercise that involved this part of the body?"    Recently began work at home and uses keyboard, mouse and phone with right arm all the time. 5. CAUSE: "What do you think is causing the shoulder pain?"    Overuse-see above explanation 6. OTHER SYMPTOMS: "Do you have any other symptoms?" (e.g., neck pain, swelling, rash, fever, numbness, weakness)    no 7. PREGNANCY: "Is there any chance you are pregnant?" "When was your last menstrual period?"     no  Protocols used: SHOULDER PAIN-A-AH

## 2017-09-25 ENCOUNTER — Encounter: Payer: Self-pay | Admitting: Family Medicine

## 2017-09-25 ENCOUNTER — Ambulatory Visit: Payer: BLUE CROSS/BLUE SHIELD | Admitting: Family Medicine

## 2017-09-25 VITALS — BP 104/64 | HR 68 | Temp 98.7°F | Ht 67.0 in | Wt 135.0 lb

## 2017-09-25 DIAGNOSIS — M5412 Radiculopathy, cervical region: Secondary | ICD-10-CM | POA: Insufficient documentation

## 2017-09-25 MED ORDER — GABAPENTIN 100 MG PO CAPS: 100.0000 mg | ORAL_CAPSULE | Freq: Three times a day (TID) | ORAL | 1 refills | Status: DC

## 2017-09-25 MED ORDER — PREDNISONE 5 MG PO TABS: ORAL_TABLET | ORAL | 0 refills | Status: DC

## 2017-09-25 NOTE — Progress Notes (Signed)
Sara Schultz - 46 y.o. female MRN [id number]  Date of birth: 06-01-1971  SUBJECTIVE:  Including CC & ROS.  Chief Complaint  Patient presents with  . Neck and right shoulder pain    Sara Schultz is a 46 y.o. female that is here for an evaluation for neck and right shoulder pain. Onset was in September but has gotten worse over the last two weeks ago. The pain is located in the right posterior aspect of her neck and pain radiates down the posterior aspect of her arm. Has had some numbness in the thumb and first finger. Pain is described as constant dull. Admits to tingling and numbness. Denies injury or surgeries. Increase in pain when she lifts her arm in certain positions. She has taken Aleve with no improvement. She did get a massage last week with no improvement. Symptoms seem to stay the same. Symptoms are worse with sitting in one position of an extended period of time. She works from home and is on the computer for extended periods of time.     Review of Systems  Constitutional: Negative for fever.  Respiratory: Negative for shortness of breath.   Cardiovascular: Negative for chest pain.  Musculoskeletal: Positive for myalgias and neck pain. Negative for gait problem.  Skin: Negative for color change.  Neurological: Positive for numbness. Negative for weakness.  Hematological: Negative for adenopathy.  Psychiatric/Behavioral: Negative for agitation.    HISTORY: Past Medical, Surgical, Social, and Family History Reviewed & Updated per EMR.   Pertinent Historical Findings include:  Past Medical History:  Diagnosis Date  . Allergic rhinitis   . Anxiety   . Depression   . Genital herpes   . Hyperlipidemia     Past Surgical History:  Procedure Laterality Date  . BREAST LUMPECTOMY WITH RADIOACTIVE SEED LOCALIZATION Right 05/23/2015   Procedure: RIGHT BREAST LUMPECTOMY WITH RADIOACTIVE SEED LOCALIZATION;  Surgeon: Chevis PrettyPaul Toth III, MD;  Location: [facility];   Service: General;  Laterality: Right;  . IVF     Egg retrieval Jan 2011  . WISDOM TOOTH EXTRACTION      Allergies  Allergen Reactions  . Penicillins     Family History  Problem Relation Age of Onset  . Heart disease Father   . Diabetes Father   . Throat cancer Maternal Grandfather   . Multiple sclerosis Mother   . Lupus Mother      Social History   Socioeconomic History  . Marital status: Divorced    Spouse name: Not on file  . Number of children: N  . Years of education: Not on file  . Highest education level: Not on file  Social Needs  . Financial resource strain: Not on file  . Food insecurity - worry: Not on file  . Food insecurity - inability: Not on file  . Transportation needs - medical: Not on file  . Transportation needs - non-medical: Not on file  Occupational History  . Occupation: Insurance claims handlertechnical advisor  Tobacco Use  . Smoking status: Never Smoker  . Smokeless tobacco: Never Used  Substance and Sexual Activity  . Alcohol use: Yes    Alcohol/week: 0.6 oz    Types: 1 Standard drinks or equivalent per week  . Drug use: No  . Sexual activity: Yes    Birth control/protection: Pill  Other Topics Concern  . Not on file  Social History Narrative   Pt is separated.  Pt is unemployed.     PHYSICAL EXAM:  VS: BP 104/64 (  BP Location: Left Arm, Patient Position: Sitting, Cuff Size: Normal)   Pulse 68   Temp 98.7 F (37.1 C) (Oral)   Ht 5\' 7"  (1.702 m)   Wt 135 lb (61.2 kg)   SpO2 100%   BMI 21.14 kg/m  Physical Exam Gen: NAD, alert, cooperative with exam, well-appearing ENT: normal lips, normal nasal mucosa,  Eye: normal EOM, normal conjunctiva and lids CV:  no edema, +2 pedal pulses   Resp: no accessory muscle use, non-labored,   Skin: no rashes, no areas of induration  Neuro: normal tone, normal sensation to touch Psych:  normal insight, alert and oriented MSK:  Neck:  TTP of the right trapezius  No TTP of the midline cervical spine  Normal  Shrug strength to resistance  Normal shoulder ROM actively  Normal elbow ROM  Normal wrist ROM  Normal pincer grasp  No signs of atrophy  No strength to resistance with wrist extension and flexion  Positive Spurling's test  Neurovascularly intact     ASSESSMENT & PLAN:   Cervical radiculopathy History and exam are suggestive of a nerve impingement. Her shoulder isn't likely a source. Positioning with her work space seems to be the problem. Discussed getting a standing desk.  - prednisone  - gabapentin  - counseled on HEP, neck and scapular  - If no improvement could try PT. Could consider cervical films

## 2017-09-25 NOTE — Patient Instructions (Signed)
Please try the exercises and the medication.  Please let me know if your symptoms don't improve then we contract physical therapy You can try to break, Aspercreme with lidocaine, or fish oil as well.

## 2017-09-25 NOTE — Assessment & Plan Note (Signed)
History and exam are suggestive of a nerve impingement. Her shoulder isn't likely a source. Positioning with her work space seems to be the problem. Discussed getting a standing desk.  - prednisone  - gabapentin  - counseled on HEP, neck and scapular  - If no improvement could try PT. Could consider cervical films

## 2017-10-22 ENCOUNTER — Ambulatory Visit: Payer: BLUE CROSS/BLUE SHIELD | Admitting: Family Medicine

## 2017-10-22 DIAGNOSIS — Z0289 Encounter for other administrative examinations: Secondary | ICD-10-CM

## 2017-11-29 DIAGNOSIS — R319 Hematuria, unspecified: Secondary | ICD-10-CM | POA: Diagnosis not present

## 2017-11-29 DIAGNOSIS — N132 Hydronephrosis with renal and ureteral calculous obstruction: Secondary | ICD-10-CM | POA: Diagnosis not present

## 2017-11-29 DIAGNOSIS — Z975 Presence of (intrauterine) contraceptive device: Secondary | ICD-10-CM | POA: Diagnosis not present

## 2017-11-29 DIAGNOSIS — F419 Anxiety disorder, unspecified: Secondary | ICD-10-CM | POA: Diagnosis not present

## 2017-11-29 DIAGNOSIS — Z30431 Encounter for routine checking of intrauterine contraceptive device: Secondary | ICD-10-CM | POA: Diagnosis not present

## 2017-11-29 DIAGNOSIS — R10813 Right lower quadrant abdominal tenderness: Secondary | ICD-10-CM | POA: Diagnosis not present

## 2017-11-29 DIAGNOSIS — R1031 Right lower quadrant pain: Secondary | ICD-10-CM | POA: Diagnosis not present

## 2017-11-29 DIAGNOSIS — N200 Calculus of kidney: Secondary | ICD-10-CM | POA: Diagnosis not present

## 2017-11-29 DIAGNOSIS — Z880 Allergy status to penicillin: Secondary | ICD-10-CM | POA: Diagnosis not present

## 2017-11-29 DIAGNOSIS — R110 Nausea: Secondary | ICD-10-CM | POA: Diagnosis not present

## 2017-12-06 DIAGNOSIS — N201 Calculus of ureter: Secondary | ICD-10-CM | POA: Diagnosis not present

## 2017-12-06 DIAGNOSIS — R3121 Asymptomatic microscopic hematuria: Secondary | ICD-10-CM | POA: Diagnosis not present

## 2017-12-06 DIAGNOSIS — N132 Hydronephrosis with renal and ureteral calculous obstruction: Secondary | ICD-10-CM | POA: Diagnosis not present

## 2017-12-20 DIAGNOSIS — N201 Calculus of ureter: Secondary | ICD-10-CM | POA: Diagnosis not present

## 2017-12-20 DIAGNOSIS — N132 Hydronephrosis with renal and ureteral calculous obstruction: Secondary | ICD-10-CM | POA: Diagnosis not present

## 2018-04-11 DIAGNOSIS — Z01419 Encounter for gynecological examination (general) (routine) without abnormal findings: Secondary | ICD-10-CM | POA: Diagnosis not present

## 2018-04-11 DIAGNOSIS — Z1231 Encounter for screening mammogram for malignant neoplasm of breast: Secondary | ICD-10-CM | POA: Diagnosis not present

## 2018-04-11 DIAGNOSIS — Z6820 Body mass index (BMI) 20.0-20.9, adult: Secondary | ICD-10-CM | POA: Diagnosis not present

## 2018-04-14 ENCOUNTER — Other Ambulatory Visit: Payer: Self-pay | Admitting: Obstetrics and Gynecology

## 2018-04-14 DIAGNOSIS — R928 Other abnormal and inconclusive findings on diagnostic imaging of breast: Secondary | ICD-10-CM

## 2018-04-15 ENCOUNTER — Ambulatory Visit
Admission: RE | Admit: 2018-04-15 | Discharge: 2018-04-15 | Disposition: A | Discharge status: Home / Self Care | Payer: BLUE CROSS/BLUE SHIELD | Source: Ambulatory Visit | Attending: Obstetrics and Gynecology | Admitting: Obstetrics and Gynecology

## 2018-04-15 ENCOUNTER — Ambulatory Visit: Payer: BLUE CROSS/BLUE SHIELD

## 2018-04-15 DIAGNOSIS — R922 Inconclusive mammogram: Secondary | ICD-10-CM | POA: Diagnosis not present

## 2018-04-15 DIAGNOSIS — R928 Other abnormal and inconclusive findings on diagnostic imaging of breast: Secondary | ICD-10-CM

## 2018-05-29 DIAGNOSIS — D225 Melanocytic nevi of trunk: Secondary | ICD-10-CM | POA: Diagnosis not present

## 2018-05-29 DIAGNOSIS — D1800 Hemangioma unspecified site: Secondary | ICD-10-CM | POA: Diagnosis not present

## 2018-05-29 DIAGNOSIS — Z86018 Personal history of other benign neoplasm: Secondary | ICD-10-CM | POA: Diagnosis not present

## 2018-05-29 DIAGNOSIS — L814 Other melanin hyperpigmentation: Secondary | ICD-10-CM | POA: Diagnosis not present

## 2018-07-17 ENCOUNTER — Ambulatory Visit (INDEPENDENT_AMBULATORY_CARE_PROVIDER_SITE_OTHER): Payer: BLUE CROSS/BLUE SHIELD | Admitting: Family Medicine

## 2018-07-17 ENCOUNTER — Encounter: Payer: Self-pay | Admitting: Family Medicine

## 2018-07-17 VITALS — BP 112/58 | HR 78 | Temp 98.8°F | Ht 67.13 in | Wt 128.4 lb

## 2018-07-17 DIAGNOSIS — Z793 Long term (current) use of hormonal contraceptives: Secondary | ICD-10-CM | POA: Diagnosis not present

## 2018-07-17 DIAGNOSIS — F418 Other specified anxiety disorders: Secondary | ICD-10-CM

## 2018-07-17 DIAGNOSIS — G4700 Insomnia, unspecified: Secondary | ICD-10-CM

## 2018-07-17 DIAGNOSIS — Z23 Encounter for immunization: Secondary | ICD-10-CM | POA: Diagnosis not present

## 2018-07-17 DIAGNOSIS — E559 Vitamin D deficiency, unspecified: Secondary | ICD-10-CM | POA: Diagnosis not present

## 2018-07-17 DIAGNOSIS — Z87442 Personal history of urinary calculi: Secondary | ICD-10-CM | POA: Insufficient documentation

## 2018-07-17 DIAGNOSIS — Z0000 Encounter for general adult medical examination without abnormal findings: Secondary | ICD-10-CM

## 2018-07-17 LAB — COMPREHENSIVE METABOLIC PANEL
ALT: 8 U/L (ref 0–35)
AST: 12 U/L (ref 0–37)
Albumin: 4.3 g/dL (ref 3.5–5.2)
Alkaline Phosphatase: 40 U/L (ref 39–117)
BUN: 14 mg/dL (ref 6–23)
CALCIUM: 8.8 mg/dL (ref 8.4–10.5)
CHLORIDE: 108 meq/L (ref 96–112)
CO2: 27 meq/L (ref 19–32)
CREATININE: 0.93 mg/dL (ref 0.40–1.20)
GFR: 69.17 mL/min (ref >60.00)
GLUCOSE: 86 mg/dL (ref 70–99)
Potassium: 4.1 mEq/L (ref 3.5–5.1)
Sodium: 140 mEq/L (ref 135–145)
Total Bilirubin: 0.7 mg/dL (ref 0.2–1.2)
Total Protein: 7.1 g/dL (ref 6.0–8.3)

## 2018-07-17 LAB — LIPID PANEL
CHOLESTEROL: 169 mg/dL (ref 0–200)
HDL: 63.3 mg/dL (ref >39.00)
LDL Cholesterol: 92 mg/dL (ref 0–99)
NonHDL: 105.36
TRIGLYCERIDES: 65 mg/dL (ref 0.0–149.0)
Total CHOL/HDL Ratio: 3
VLDL: 13 mg/dL (ref 0.0–40.0)

## 2018-07-17 LAB — CBC WITH DIFFERENTIAL/PLATELET
BASOS PCT: 0.5 % (ref 0.0–3.0)
Basophils Absolute: 0 10*3/uL (ref 0.0–0.1)
Eosinophils Absolute: 0 10*3/uL (ref 0.0–0.7)
Eosinophils Relative: 0.4 % (ref 0.0–5.0)
HCT: 41.7 % (ref 36.0–46.0)
HEMOGLOBIN: 14 g/dL (ref 12.0–15.0)
LYMPHS ABS: 1.3 10*3/uL (ref 0.7–4.0)
Lymphocytes Relative: 22.8 % (ref 12.0–46.0)
MCHC: 33.6 g/dL (ref 30.0–36.0)
MCV: 94 fl (ref 78.0–100.0)
MONO ABS: 0.5 10*3/uL (ref 0.1–1.0)
Monocytes Relative: 8.4 % (ref 3.0–12.0)
NEUTROS ABS: 3.8 10*3/uL (ref 1.4–7.7)
NEUTROS PCT: 67.9 % (ref 43.0–77.0)
PLATELETS: 195 10*3/uL (ref 150.0–400.0)
RBC: 4.43 Mil/uL (ref 3.87–5.11)
RDW: 13 % (ref 11.5–15.5)
WBC: 5.6 10*3/uL (ref 4.0–10.5)

## 2018-07-17 LAB — TSH: TSH: 1.14 u[IU]/mL (ref 0.35–4.50)

## 2018-07-17 LAB — VITAMIN D 25 HYDROXY (VIT D DEFICIENCY, FRACTURES): VITD: 28.93 ng/mL — AB (ref 30.00–100.00)

## 2018-07-17 MED ORDER — TRAZODONE HCL 50 MG PO TABS: 25.0000 mg | ORAL_TABLET | Freq: Every evening | ORAL | 3 refills | Status: DC | PRN

## 2018-07-17 MED ORDER — ALPRAZOLAM 0.25 MG PO TABS: 0.2500 mg | ORAL_TABLET | Freq: Two times a day (BID) | ORAL | 5 refills | Status: DC | PRN

## 2018-07-17 MED ORDER — CITALOPRAM HYDROBROMIDE 20 MG PO TABS: 20.0000 mg | ORAL_TABLET | Freq: Every day | ORAL | 3 refills | Status: DC

## 2018-07-17 NOTE — Assessment & Plan Note (Signed)
  The problem of recurrent insomnia is discussed. Avoidance of caffeine sources is strongly encouraged. Sleep hygiene issues are reviewed. She is willing to try trazodone again- eRx sent. She will update me in a few weeks.

## 2018-07-17 NOTE — Patient Instructions (Addendum)
Great to see you. I will call you with your lab results from today and you can view them online.   Start Celexa 20 mg daily. Take 1/2 tablet daily for 3-5  days, then advance to 1 full tablet thereafter. We discussed possible side effects of headache, GI upset, drowsiness.  Trazodone 25- 50 mg nightly for insomnia.  Please keep me updated in a few weeks.   Please sign a request for most recent PAP and Mammogram from [facility] :)

## 2018-07-17 NOTE — Progress Notes (Signed)
Subjective:   Patient ID: Sara Schultz, female    DOB: 23-Aug-1971, 47 y.o.   MRN: [id number]  Sara Schultz is a pleasant 47 y.o. year old female who presents to clinic today with Annual Exam (Patient is here today for a CPE without PAP.  She has a GYN Esperanza Richters had PAP & Mammogram in August and WNL.  She is currently fasting.  She agrees to get flu shot.)  on 07/17/2018  HPI:  Health Maintenance  Topic Date Due  . INFLUENZA VACCINE  05/08/2018  . PAP SMEAR  06/20/2020  . TETANUS/TDAP  08/21/2027  . HIV Screening  Completed    H/o right breast complex sclerosing lesions- s/p excision (Dr. [name]) in 05/2015. Mammogram 04/15/2018 Has GYN- pap smear done in 05/2018. Has IUD  Depression/anxiety- chronic issue, deteriorated over the past year.  PHQ is 14. Started working from home last year- feels very isolated.  Also had to put her dog down recently. Wants to go back to school and further her zoology degree.    Has been on and off medications for years. SSRIs (including prozac), was also on amitriptyline with GI side effects. In 2014, tried Wellbutrin XL- had worsening issues with insomnia so we tried short acting twice daily dosing but had the same issue. Tried zoloft two years ago- weaned herself off- GI and "zombie like" side effects.  Trazodone was also not effective. Unisom and melatonin are currently what she takes for insomnia and has been the most effective.  She is using as needed xanax- takes it no more than 3 times per week.  H/o nephrolithiasis- first one in 11/2017.  [facility].  Was not referred to urology. Calcium oxylate stones.    Current Outpatient Medications on File Prior to Visit  Medication Sig Dispense Refill  . fluticasone (FLONASE) 50 MCG/ACT nasal spray Place 1 spray into the nose daily. 16 g 6  . levonorgestrel (MIRENA) 20 MCG/24HR IUD 1 each by Intrauterine route once.    . Multiple Vitamin  (MULTIVITAMIN) tablet Take 1 tablet by mouth daily.    . valACYclovir (VALTREX) 500 MG tablet Take 500 mg by mouth daily.      Marland Kitchen gabapentin (NEURONTIN) 100 MG capsule Take 1 capsule (100 mg total) by mouth 3 (three) times daily. (Patient not taking: Reported on 07/17/2018) 90 capsule 1   No current facility-administered medications on file prior to visit.     Allergies  Allergen Reactions  . Penicillins     Past Medical History:  Diagnosis Date  . Allergic rhinitis   . Anxiety   . Depression   . Genital herpes   . Hyperlipidemia     Past Surgical History:  Procedure Laterality Date  . BREAST EXCISIONAL BIOPSY Right   . BREAST LUMPECTOMY WITH RADIOACTIVE SEED LOCALIZATION Right 05/23/2015   Procedure: RIGHT BREAST LUMPECTOMY WITH RADIOACTIVE SEED LOCALIZATION;  Surgeon: Chevis Pretty III, MD;  Location: [facility];  Service: General;  Laterality: Right;  . IVF     Egg retrieval Jan 2011  . WISDOM TOOTH EXTRACTION      Family History  Problem Relation Age of Onset  . Heart disease Father   . Diabetes Father   . Throat cancer Maternal Grandfather   . Multiple sclerosis Mother   . Lupus Mother   . Breast cancer Neg Hx     Social History   Socioeconomic History  . Marital status: Divorced  Spouse name: Not on file  . Number of children: N  . Years of education: Not on file  . Highest education level: Not on file  Occupational History  . Occupation: Insurance claims handler  Social Needs  . Financial resource strain: Not on file  . Food insecurity:    Worry: Not on file    Inability: Not on file  . Transportation needs:    Medical: Not on file    Non-medical: Not on file  Tobacco Use  . Smoking status: Never Smoker  . Smokeless tobacco: Never Used  Substance and Sexual Activity  . Alcohol use: Yes    Alcohol/week: 1.0 standard drinks    Types: 1 Standard drinks or equivalent per week  . Drug use: No    Frequency: 1.0 times per week  . Sexual  activity: Yes    Birth control/protection: Pill  Lifestyle  . Physical activity:    Days per week: Not on file    Minutes per session: Not on file  . Stress: Not on file  Relationships  . Social connections:    Talks on phone: Not on file    Gets together: Not on file    Attends religious service: Not on file    Active member of club or organization: Not on file    Attends meetings of clubs or organizations: Not on file    Relationship status: Not on file  . Intimate partner violence:    Fear of current or ex partner: Not on file    Emotionally abused: Not on file    Physically abused: Not on file    Forced sexual activity: Not on file  Other Topics Concern  . Not on file  Social History Narrative   Pt is separated.  Pt is unemployed.   The PMH, PSH, Social History, Family History, Medications, and allergies have been reviewed in [facility], and have been updated if relevant.   Review of Systems  Constitutional: Positive for unexpected weight change.  HENT: Negative.   Eyes: Negative.   Respiratory: Negative.   Cardiovascular: Negative.   Gastrointestinal: Negative.   Endocrine: Negative.   Genitourinary: Negative.   Musculoskeletal: Negative.   Skin: Negative.   Allergic/Immunologic: Negative.   Neurological: Negative.   Psychiatric/Behavioral: Positive for decreased concentration, dysphoric mood and sleep disturbance. Negative for agitation, behavioral problems, confusion, hallucinations, self-injury and suicidal ideas. The patient is nervous/anxious. The patient is not hyperactive.   All other systems reviewed and are negative.      Objective:    BP (!) 112/58 (BP Location: Left Arm, Patient Position: Sitting, Cuff Size: Normal)   Pulse 78   Temp 98.8 F (37.1 C) (Oral)   Ht 5' 7.13" (1.705 m)   Wt 128 lb 6.4 oz (58.2 kg)   SpO2 98%   BMI 20.03 kg/m    Wt Readings from Last 3 Encounters:  07/17/18 128 lb 6.4 oz (58.2 kg)  09/25/17 135 lb (61.2 kg)  08/20/17  135 lb 12.8 oz (61.6 kg)    Physical Exam   General:  Well-developed,well-nourished,in no acute distress; alert,appropriate and cooperative throughout examination Head:  normocephalic and atraumatic.   Eyes:  vision grossly intact, PERRL Ears:  R ear normal and L ear normal externally, TMs clear bilaterally Nose:  no external deformity.   Mouth:  good dentition.   Neck:  No deformities, masses, or tenderness noted. Lungs:  Normal respiratory effort, chest expands symmetrically. Lungs are clear to auscultation, no crackles or wheezes.  Heart:  Normal rate and regular rhythm. S1 and S2 normal without gallop, murmur, click, rub or other extra sounds. Abdomen:  Bowel sounds positive,abdomen soft and non-tender without masses, organomegaly or hernias noted. Msk:  No deformity or scoliosis noted of thoracic or lumbar spine.   Extremities:  No clubbing, cyanosis, edema, or deformity noted with normal full range of motion of all joints.   Neurologic:  alert & oriented X3 and gait normal.   Skin:  Intact without suspicious lesions or rashes Cervical Nodes:  No lymphadenopathy noted Axillary Nodes:  No palpable lymphadenopathy Psych:  Cognition and judgment appear intact. Alert and cooperative with normal attention span and concentration. No apparent delusions, illusions, hallucinations      Assessment & Plan:   Well woman exam without gynecological exam  Insomnia, unspecified type  Depression with anxiety  Need for influenza vaccination - Plan: Flu Vaccine QUAD 6+ mos PF IM (Fluarix Quad PF)  History of kidney stones No follow-ups on file.

## 2018-07-17 NOTE — Assessment & Plan Note (Signed)
Deteriorated. Discussed tx options- she agrees to starting rx and psych referral for therapy. eRx sent for celexa 20 mg daily, referral placed to psychotherapy. She will update me in a few weeks.

## 2018-07-17 NOTE — Assessment & Plan Note (Signed)
Reviewed preventive care protocols, scheduled due services, and updated immunizations Discussed nutrition, exercise, diet, and healthy lifestyle.  

## 2018-07-22 ENCOUNTER — Telehealth: Payer: Self-pay | Admitting: Family Medicine

## 2018-07-22 NOTE — Telephone Encounter (Signed)
Copied from CRM [id number]. Topic: Quick Communication - Rx Refill/Question >> Jul 22, 2018 11:38 AM Crist Infante wrote: Medication: Vit D3 50,000 units   Dr was going to prescribe this med, but was not sent.  Would like sent to [id number] - [place], [place] - [address] AT [facility] [address] [phone] (Phone) [phone] (Fax)

## 2018-07-23 ENCOUNTER — Other Ambulatory Visit: Payer: Self-pay | Admitting: Family Medicine

## 2018-07-23 DIAGNOSIS — R7989 Other specified abnormal findings of blood chemistry: Secondary | ICD-10-CM

## 2018-07-23 MED ORDER — VITAMIN D3 1.25 MG (50000 UT) PO TABS: 50000.0000 [IU] | ORAL_TABLET | ORAL | 0 refills | Status: DC

## 2018-07-23 NOTE — Telephone Encounter (Signed)
Rx sent in today by Dr. Dayton Martes.

## 2018-07-28 ENCOUNTER — Encounter: Payer: Self-pay | Admitting: Family Medicine

## 2018-09-07 ENCOUNTER — Other Ambulatory Visit: Payer: Self-pay | Admitting: Family Medicine

## 2018-09-08 NOTE — Telephone Encounter (Signed)
OK to refill

## 2018-09-26 ENCOUNTER — Other Ambulatory Visit (INDEPENDENT_AMBULATORY_CARE_PROVIDER_SITE_OTHER): Payer: BLUE CROSS/BLUE SHIELD

## 2018-09-26 ENCOUNTER — Encounter: Payer: Self-pay | Admitting: Family Medicine

## 2018-09-26 DIAGNOSIS — R7989 Other specified abnormal findings of blood chemistry: Secondary | ICD-10-CM

## 2018-09-26 LAB — VITAMIN D 25 HYDROXY (VIT D DEFICIENCY, FRACTURES): VITD: 41.49 ng/mL (ref 30.00–100.00)

## 2018-10-03 ENCOUNTER — Other Ambulatory Visit: Payer: Self-pay | Admitting: Family Medicine

## 2018-10-03 MED ORDER — ZALEPLON 5 MG PO CAPS: 5.0000 mg | ORAL_CAPSULE | Freq: Every evening | ORAL | 0 refills | Status: DC | PRN

## 2018-10-07 ENCOUNTER — Telehealth: Payer: Self-pay | Admitting: Family Medicine

## 2018-10-07 NOTE — Telephone Encounter (Signed)
Copied from CRM [phone]#[id number]. Topic: Quick Communication - See Telephone Encounter >> Oct 07, 2018 11:38 AM Windy KalataMichael, Novaleigh Kohlman L, NT wrote: CRM for notification. See Telephone encounter for: 10/07/18.  Patient is calling and states zaleplon (SONATA) 5 MG capsule is needing a prior authorization.

## 2018-10-09 NOTE — Telephone Encounter (Signed)
Pa approved, form faxed back to pharmacy. 

## 2018-11-08 ENCOUNTER — Other Ambulatory Visit: Payer: Self-pay | Admitting: Family Medicine

## 2018-11-17 ENCOUNTER — Encounter: Payer: Self-pay | Admitting: Family Medicine

## 2018-12-20 ENCOUNTER — Other Ambulatory Visit: Payer: Self-pay | Admitting: Family Medicine

## 2019-03-31 ENCOUNTER — Other Ambulatory Visit: Payer: Self-pay | Admitting: Family Medicine

## 2019-05-14 DIAGNOSIS — Z1231 Encounter for screening mammogram for malignant neoplasm of breast: Secondary | ICD-10-CM | POA: Diagnosis not present

## 2019-05-14 DIAGNOSIS — Z6820 Body mass index (BMI) 20.0-20.9, adult: Secondary | ICD-10-CM | POA: Diagnosis not present

## 2019-05-14 DIAGNOSIS — Z01419 Encounter for gynecological examination (general) (routine) without abnormal findings: Secondary | ICD-10-CM | POA: Diagnosis not present

## 2019-05-26 ENCOUNTER — Encounter: Payer: Self-pay | Admitting: Family Medicine

## 2019-07-06 DIAGNOSIS — Z86018 Personal history of other benign neoplasm: Secondary | ICD-10-CM | POA: Diagnosis not present

## 2019-07-06 DIAGNOSIS — L821 Other seborrheic keratosis: Secondary | ICD-10-CM | POA: Diagnosis not present

## 2019-07-06 DIAGNOSIS — L814 Other melanin hyperpigmentation: Secondary | ICD-10-CM | POA: Diagnosis not present

## 2019-07-06 DIAGNOSIS — D225 Melanocytic nevi of trunk: Secondary | ICD-10-CM | POA: Diagnosis not present

## 2019-08-05 ENCOUNTER — Telehealth: Payer: Self-pay

## 2019-08-05 DIAGNOSIS — D511 Vitamin B12 deficiency anemia due to selective vitamin B12 malabsorption with proteinuria: Secondary | ICD-10-CM

## 2019-08-05 DIAGNOSIS — E559 Vitamin D deficiency, unspecified: Secondary | ICD-10-CM

## 2019-08-05 DIAGNOSIS — Z793 Long term (current) use of hormonal contraceptives: Secondary | ICD-10-CM

## 2019-08-05 DIAGNOSIS — E785 Hyperlipidemia, unspecified: Secondary | ICD-10-CM

## 2019-08-05 NOTE — Telephone Encounter (Signed)
Copied from Mobile [phone]. Topic: Quick Communication - See Telephone Encounter >> Aug 03, 2019 11:50 AM Loma Boston wrote: CRM for notification. See Telephone encounter for: 08/03/19. Pt would like Dr Deborra Medina to order labs for a work up for CPE on 11/12 plus Viti D and Thyroid, please place order if possible and will call back in for sch. In no FU [phone]

## 2019-08-05 NOTE — Addendum Note (Signed)
Addended by: Lucille Passy on: 08/05/2019 01:03 PM   Modules accepted: Orders

## 2019-08-05 NOTE — Telephone Encounter (Signed)
Yes orders entered.  Thank you.

## 2019-08-10 ENCOUNTER — Other Ambulatory Visit: Payer: Self-pay

## 2019-08-10 ENCOUNTER — Other Ambulatory Visit (INDEPENDENT_AMBULATORY_CARE_PROVIDER_SITE_OTHER): Payer: BC Managed Care – PPO

## 2019-08-10 DIAGNOSIS — E785 Hyperlipidemia, unspecified: Secondary | ICD-10-CM

## 2019-08-10 DIAGNOSIS — E559 Vitamin D deficiency, unspecified: Secondary | ICD-10-CM

## 2019-08-10 DIAGNOSIS — D511 Vitamin B12 deficiency anemia due to selective vitamin B12 malabsorption with proteinuria: Secondary | ICD-10-CM

## 2019-08-10 DIAGNOSIS — Z793 Long term (current) use of hormonal contraceptives: Secondary | ICD-10-CM | POA: Diagnosis not present

## 2019-08-10 LAB — COMPREHENSIVE METABOLIC PANEL
ALT: 10 U/L (ref 0–35)
AST: 17 U/L (ref 0–37)
Albumin: 4.3 g/dL (ref 3.5–5.2)
Alkaline Phosphatase: 42 U/L (ref 39–117)
BUN: 17 mg/dL (ref 6–23)
CO2: 29 mEq/L (ref 19–32)
Calcium: 8.8 mg/dL (ref 8.4–10.5)
Chloride: 103 mEq/L (ref 96–112)
Creatinine, Ser: 0.9 mg/dL (ref 0.40–1.20)
GFR: 67.27 mL/min (ref >60.00)
Glucose, Bld: 85 mg/dL (ref 70–99)
Potassium: 3.9 mEq/L (ref 3.5–5.1)
Sodium: 139 mEq/L (ref 135–145)
Total Bilirubin: 0.8 mg/dL (ref 0.2–1.2)
Total Protein: 6.6 g/dL (ref 6.0–8.3)

## 2019-08-10 LAB — CBC WITH DIFFERENTIAL/PLATELET
Basophils Absolute: 0 10*3/uL (ref 0.0–0.1)
Basophils Relative: 0.4 % (ref 0.0–3.0)
Eosinophils Absolute: 0 10*3/uL (ref 0.0–0.7)
Eosinophils Relative: 0.9 % (ref 0.0–5.0)
HCT: 40.7 % (ref 36.0–46.0)
Hemoglobin: 13.6 g/dL (ref 12.0–15.0)
Lymphocytes Relative: 31.1 % (ref 12.0–46.0)
Lymphs Abs: 1.5 10*3/uL (ref 0.7–4.0)
MCHC: 33.5 g/dL (ref 30.0–36.0)
MCV: 94.5 fl (ref 78.0–100.0)
Monocytes Absolute: 0.4 10*3/uL (ref 0.1–1.0)
Monocytes Relative: 7.9 % (ref 3.0–12.0)
Neutro Abs: 2.9 10*3/uL (ref 1.4–7.7)
Neutrophils Relative %: 59.7 % (ref 43.0–77.0)
Platelets: 189 10*3/uL (ref 150.0–400.0)
RBC: 4.31 Mil/uL (ref 3.87–5.11)
RDW: 12.6 % (ref 11.5–15.5)
WBC: 4.9 10*3/uL (ref 4.0–10.5)

## 2019-08-10 LAB — VITAMIN B12: Vitamin B-12: 737 pg/mL (ref 211–911)

## 2019-08-10 LAB — LIPID PANEL
Cholesterol: 179 mg/dL (ref 0–200)
HDL: 58.3 mg/dL (ref >39.00)
LDL Cholesterol: 103 mg/dL — ABNORMAL HIGH (ref 0–99)
NonHDL: 120.54
Total CHOL/HDL Ratio: 3
Triglycerides: 88 mg/dL (ref 0.0–149.0)
VLDL: 17.6 mg/dL (ref 0.0–40.0)

## 2019-08-10 LAB — TSH: TSH: 1.16 u[IU]/mL (ref 0.35–4.50)

## 2019-08-10 LAB — VITAMIN D 25 HYDROXY (VIT D DEFICIENCY, FRACTURES): VITD: 50.76 ng/mL (ref 30.00–100.00)

## 2019-08-10 LAB — T4, FREE: Free T4: 0.89 ng/dL (ref 0.60–1.60)

## 2019-08-20 ENCOUNTER — Encounter: Payer: BLUE CROSS/BLUE SHIELD | Admitting: Family Medicine

## 2019-08-22 DIAGNOSIS — E559 Vitamin D deficiency, unspecified: Secondary | ICD-10-CM | POA: Insufficient documentation

## 2019-08-22 NOTE — Progress Notes (Addendum)
Subjective:   Patient ID: Sara Schultz, female    DOB: 07-13-1971, 48 y.o.   MRN: [id number]  Sara Schultz is a pleasant 48 y.o. year old female who presents to clinic today with Annual Exam (Patient is here today for a CPE without PAP.  She sees Dr. Leonette Most and last PAP completed on 8.6.20.  She has IUD placed. She is due for her flu shot. Last Mammogram at [facility] on 7.2019 which she will call and schedule if order is placed. )  on 08/24/2019  HPI:  Health Maintenance  Topic Date Due  . PAP SMEAR-Modifier  06/20/2020  . TETANUS/TDAP  08/21/2027  . INFLUENZA VACCINE  Completed  . HIV Screening  Completed    H/o right breast complex sclerosing lesions- s/p excision (Dr. [name]) in 05/2015. Mammogram 04/15/2018 Has GYN- Dr. Renaldo Fiddler, last saw her on 05/14/19, mammogram done-smear done in 05/2018. Has Mirena IUD   Depression/anxiety- chronic issue, deteriorated over the past year.  PHQ was 14 when I last saw her on 07/17/18- note reviewed.   At that time, she had just started working from home and told me she felt isolated, had also had to put her dog down recently.  At that time she told me that she had  been on and off medications for years. SSRIs (including prozac), was also on amitriptyline with GI side effects. In 2014, tried Wellbutrin XL- had worsening issues with insomnia so we tried short acting twice daily dosing but had the same issue. Tried zoloft two years ago- weaned herself off- GI and "zombie like" side effects. She agreed to psych referral and I started her on celexa 20 mg daily which she is no longer taking.  She did feel it helped with her anxiety and depression but thought it was worsening sleep but it wasn't.  She did get a new puppy, named Gabe.  Depression screen [facility] 2/9 08/24/2019 07/17/2018 08/20/2017  Decreased Interest Down, Depressed, Hopeless PHQ - 2 Score Altered sleeping Tired, decreased energy Change in appetite 0 1 0  Feeling bad or failure about yourself  1 1 0  Trouble concentrating 3 2 0  Moving slowly or fidgety/restless 0 0 0  Suicidal thoughts 0 0 0  PHQ-9 Score Difficult doing work/chores Somewhat difficult Very difficult Very difficult   GAD 7 : Generalized Anxiety Score 08/24/2019 07/17/2018  Nervous, Anxious, on Edge 3 3  Control/stop worrying 3 3  Worry too much - different things 3 3  Trouble relaxing 2 2  Restless 0 0  Easily annoyed or irritable 2 1  Afraid - awful might happen 0 0  Total GAD 7 Score 13 12  Anxiety Difficulty Very difficult Very difficult      Insomnia- Trazodone was also not effective in past but she was willing to try it again so we restarted that on 07/17/18 as well. It did not help and it made her sweat at night so she stopped taking it. I sent in Sonata in 09/2018 but she is no longer taking that either.Unisom and melatonin are currently what she takes for insomnia and has been the most effective.  She is taking as needed xanax- takes it no more than 3 times per week.  PDMP reviewed- last refilled on 11/20/18 (60 tablets).  H/o nephrolithiasis- first one in 11/2017.  [employer]  to [facility]- passed it in 10 days.  Was not referred to urology. Calcium oxylate stones.  Lab Results  Component Value Date   TSH 1.16 08/10/2019   Lab Results  Component Value Date   CHOL 179 08/10/2019   HDL 58.30 08/10/2019   LDLCALC 103 (H) 08/10/2019   TRIG 88.0 08/10/2019   CHOLHDL 3 08/10/2019   Lab Results  Component Value Date   CREATININE 0.90 08/10/2019   Lab Results  Component Value Date   WBC 4.9 08/10/2019   HGB 13.6 08/10/2019   HCT 40.7 08/10/2019   MCV 94.5 08/10/2019   PLT 189.0 08/10/2019   Lab Results  Component Value Date   ALT 10 08/10/2019   AST 17 08/10/2019   ALKPHOS 42 08/10/2019   BILITOT 0.8 08/10/2019   Lab Results  Component Value Date   VD25OH 50.76 08/10/2019   VD25OH 41.49 09/26/2018    VD25OH 28.93 (L) 07/17/2018       Current Outpatient Medications on File Prior to Visit  Medication Sig Dispense Refill  . fluticasone (FLONASE) 50 MCG/ACT nasal spray Place 1 spray into the nose daily. 16 g 6  . levonorgestrel (MIRENA) 20 MCG/24HR IUD 1 each by Intrauterine route once.    . Multiple Vitamin (MULTIVITAMIN) tablet Take 1 tablet by mouth daily.    . valACYclovir (VALTREX) 500 MG tablet Take 500 mg by mouth daily.       No current facility-administered medications on file prior to visit.     Allergies  Allergen Reactions  . Penicillins     Past Medical History:  Diagnosis Date  . Allergic rhinitis   . Anxiety   . Depression   . Genital herpes   . Hyperlipidemia     Past Surgical History:  Procedure Laterality Date  . BREAST EXCISIONAL BIOPSY Right   . BREAST LUMPECTOMY WITH RADIOACTIVE SEED LOCALIZATION Right 05/23/2015   Procedure: RIGHT BREAST LUMPECTOMY WITH RADIOACTIVE SEED LOCALIZATION;  Surgeon: Chevis Pretty III, MD;  Location: [facility];  Service: General;  Laterality: Right;  . IVF     Egg retrieval Jan 2011  . WISDOM TOOTH EXTRACTION      Family History  Problem Relation Age of Onset  . Heart disease Father   . Diabetes Father   . Throat cancer Maternal Grandfather   . Multiple sclerosis Mother   . Lupus Mother   . Breast cancer Neg Hx     Social History   Socioeconomic History  . Marital status: Divorced    Spouse name: Not on file  . Number of children: N  . Years of education: Not on file  . Highest education level: Not on file  Occupational History  . Occupation: Insurance claims handler  Social Needs  . Financial resource strain: Not on file  . Food insecurity    Worry: Not on file    Inability: Not on file  . Transportation needs    Medical: Not on file    Non-medical: Not on file  Tobacco Use  . Smoking status: Never Smoker  . Smokeless tobacco: Never Used  Substance and Sexual Activity  . Alcohol use: Yes     Alcohol/week: 1.0 standard drinks    Types: 1 Standard drinks or equivalent per week  . Drug use: No    Frequency: 1.0 times per week  . Sexual activity: Yes    Birth control/protection: Pill  Lifestyle  . Physical activity    Days per week: Not on  file    Minutes per session: Not on file  . Stress: Not on file  Relationships  . Social Musicianconnections    Talks on phone: Not on file    Gets together: Not on file    Attends religious service: Not on file    Active member of club or organization: Not on file    Attends meetings of clubs or organizations: Not on file    Relationship status: Not on file  . Intimate partner violence    Fear of current or ex partner: Not on file    Emotionally abused: Not on file    Physically abused: Not on file    Forced sexual activity: Not on file  Other Topics Concern  . Not on file  Social History Narrative   Pt is separated.  Pt is unemployed.   The PMH, PSH, Social History, Family History, Medications, and allergies [facility], and have been updated if relevant.   Review of Systems  Constitutional: Negative for unexpected weight change.  HENT: Negative.   Eyes: Negative.   Respiratory: Negative.   Cardiovascular: Negative.   Gastrointestinal: Negative.   Endocrine: Negative.   Genitourinary: Negative.   Musculoskeletal: Negative.   Skin: Negative.   Allergic/Immunologic: Negative.   Neurological: Negative.   Psychiatric/Behavioral: Positive for dysphoric mood and sleep disturbance. Negative for agitation, behavioral problems, confusion, decreased concentration, hallucinations, self-injury and suicidal ideas. The patient is nervous/anxious. The patient is not hyperactive.   All other systems reviewed and are negative.      Objective:    BP 98/70 (BP Location: Left Arm, Patient Position: Sitting, Cuff Size: Normal)   Pulse 97   Temp 98.8 F (37.1 C) (Oral)   Ht 5' 6.5" (1.689 m)   Wt 130 lb 9.6 oz (59.2 kg)   SpO2  97%   BMI 20.76 kg/m    Wt Readings from Last 3 Encounters:  08/24/19 130 lb 9.6 oz (59.2 kg)  07/17/18 128 lb 6.4 oz (58.2 kg)  09/25/17 135 lb (61.2 kg)    Physical Exam   General:  Well-developed,well-nourished,in no acute distress; alert,appropriate and cooperative throughout examination Head:  normocephalic and atraumatic.   Eyes:  vision grossly intact, PERRL Ears:  R ear normal and L ear normal externally, TMs clear bilaterally Nose:  no external deformity.   Mouth:  good dentition.   Neck:  No deformities, masses, or tenderness noted. Breasts:  No mass, nodules, thickening, tenderness, bulging, retraction, inflamation, nipple discharge or skin changes noted.   Lungs:  Normal respiratory effort, chest expands symmetrically. Lungs are clear to auscultation, no crackles or wheezes. Heart:  Normal rate and regular rhythm. S1 and S2 normal without gallop, murmur, click, rub or other extra sounds. Abdomen:  Bowel sounds positive,abdomen soft and non-tender without masses, organomegaly or hernias noted. Msk:  No deformity or scoliosis noted of thoracic or lumbar spine.   Extremities:  No clubbing, cyanosis, edema, or deformity noted with normal full range of motion of all joints.   Neurologic:  alert & oriented X3 and gait normal.   Skin:  Intact without suspicious lesions or rashes Cervical Nodes:  No lymphadenopathy noted Axillary Nodes:  No palpable lymphadenopathy Psych:  Cognition and judgment appear intact. Alert and cooperative with normal attention span and concentration. No apparent delusions, illusions, hallucinations       Assessment & Plan:   Well woman exam without gynecological exam - Plan: CBC w/Diff  Persistent disorder of initiating or  maintaining sleep  Depression with anxiety  Hyperlipidemia, unspecified hyperlipidemia type - Plan: Well woman- non DM- CMET, Well woman- non DM- lipid, well woman- non DM- TSH, CBC w/Diff, CANCELED: Well woman- non DM- CBC   Vitamin D deficiency - Plan: Vitamin D (25 hydroxy), CBC w/Diff  Need for immunization against influenza - Plan: Flu Vaccine QUAD 36+ mos IM  Insomnia, unspecified type No follow-ups on file.

## 2019-08-24 ENCOUNTER — Ambulatory Visit (INDEPENDENT_AMBULATORY_CARE_PROVIDER_SITE_OTHER): Payer: BC Managed Care – PPO | Admitting: Family Medicine

## 2019-08-24 ENCOUNTER — Other Ambulatory Visit: Payer: Self-pay

## 2019-08-24 ENCOUNTER — Encounter: Payer: Self-pay | Admitting: Family Medicine

## 2019-08-24 VITALS — BP 98/70 | HR 97 | Temp 98.8°F | Ht 66.5 in | Wt 130.6 lb

## 2019-08-24 DIAGNOSIS — G4700 Insomnia, unspecified: Secondary | ICD-10-CM | POA: Diagnosis not present

## 2019-08-24 DIAGNOSIS — F418 Other specified anxiety disorders: Secondary | ICD-10-CM | POA: Diagnosis not present

## 2019-08-24 DIAGNOSIS — E559 Vitamin D deficiency, unspecified: Secondary | ICD-10-CM

## 2019-08-24 DIAGNOSIS — E785 Hyperlipidemia, unspecified: Secondary | ICD-10-CM | POA: Diagnosis not present

## 2019-08-24 DIAGNOSIS — Z23 Encounter for immunization: Secondary | ICD-10-CM

## 2019-08-24 DIAGNOSIS — Z0000 Encounter for general adult medical examination without abnormal findings: Secondary | ICD-10-CM | POA: Diagnosis not present

## 2019-08-24 MED ORDER — CITALOPRAM HYDROBROMIDE 20 MG PO TABS: 20.0000 mg | ORAL_TABLET | Freq: Every day | ORAL | 3 refills | Status: AC

## 2019-08-24 MED ORDER — ALPRAZOLAM 0.25 MG PO TABS: 0.2500 mg | ORAL_TABLET | Freq: Two times a day (BID) | ORAL | 0 refills | Status: AC | PRN

## 2019-08-24 NOTE — Addendum Note (Signed)
Addended by: Lucille Passy on: 08/24/2019 02:27 PM   Modules accepted: Orders

## 2019-08-24 NOTE — Addendum Note (Signed)
Addended by: Lucille Passy on: 08/24/2019 02:29 PM   Modules accepted: Orders

## 2019-08-24 NOTE — Assessment & Plan Note (Addendum)
Total time spent with patient was 45 minutes, with 25 minutes spent specifically on problem visit concerning Insomnia. Greater than 50 percent of the 25 minutes was spent in counseling anxiety, depression and insomnia.  She is wanting to try Celexa again 20 mg daily- advised trying to take it at night. The dog has helped as well.   She is taking as needed xanax- takes it no more than 3 times per week.  PDMP reviewed- last refilled on 11/20/18 (60 tablets).  eRX refills sent.  Denies SI or HI.

## 2019-08-24 NOTE — Patient Instructions (Addendum)
Great to see you!  We are restarting celexa 20 mg daily (try taking it at night).  We are referring you to a neurologist for a sleep study.

## 2019-08-24 NOTE — Assessment & Plan Note (Addendum)
She stopped taking anything at bedtime since nothing was working- she is going to try taking celexa at bedtime. Refer to neuro for home sleep study.

## 2019-08-24 NOTE — Assessment & Plan Note (Addendum)
Reviewed preventive care protocols, scheduled due services, and updated immunizations Discussed nutrition, exercise, diet, and healthy lifestyle.  Influenza vaccine given today. 

## 2019-08-24 NOTE — Assessment & Plan Note (Signed)
Due for labs today. 

## 2019-09-07 ENCOUNTER — Encounter: Payer: Self-pay | Admitting: Neurology

## 2019-09-07 ENCOUNTER — Ambulatory Visit (INDEPENDENT_AMBULATORY_CARE_PROVIDER_SITE_OTHER): Payer: BC Managed Care – PPO | Admitting: Neurology

## 2019-09-07 ENCOUNTER — Other Ambulatory Visit: Payer: Self-pay

## 2019-09-07 VITALS — BP 110/70 | HR 94 | Temp 97.8°F | Ht 67.0 in | Wt 128.5 lb

## 2019-09-07 DIAGNOSIS — R6889 Other general symptoms and signs: Secondary | ICD-10-CM

## 2019-09-07 DIAGNOSIS — G479 Sleep disorder, unspecified: Secondary | ICD-10-CM

## 2019-09-07 DIAGNOSIS — G4700 Insomnia, unspecified: Secondary | ICD-10-CM

## 2019-09-07 DIAGNOSIS — G4719 Other hypersomnia: Secondary | ICD-10-CM | POA: Diagnosis not present

## 2019-09-07 NOTE — Progress Notes (Signed)
Subjective:    Patient ID: Sara Schultz is a 48 y.o. female.  HPI     Star Age, MD, PhD [facility] [address] [place], [place] [address]  Dear Dr. Deborra Medina,   I saw your patient, Sara Schultz, upon your kind request, in my sleep clinic today for initial consultation of her sleep disorder, in particular, her difficulty initiating and maintaining sleep after several years duration.  The patient is unaccompanied today.  As you know, Sara Schultz is a 48 year old right-handed woman with an underlying history of hyperlipidemia, nephrolithiasis, allergic rhinitis, depression and anxiety, who reports a longstanding history of difficulty maintaining sleep.  She had a sleep study in 2012 and I reviewed the results: Date of study was 06/01/2011, study was interpreted by Dr. Baird Lyons.  Overall AHI was 0.2/h, O2 nadir 94%, she had no significant PLM's. 6 her sleep efficiency was reduced at 68.6%, sleep latency was 75.5 minutes, REM latency 122 minutes, she had absence of slow-wave sleep and a significantly reduced percentage of REM sleep. She has tried over-the-counter medication including Unisom and melatonin. She has tried trazodone and Sonata as well.  She has also been on several medications at different times for her anxiety and depression.  I reviewed your office note from 08/24/2019.  She feels tired during the day but is not sleeping at the wheel or at work.  She works from home.  She tries to be in bed around 11 and rise time is around seven.  She often wakes up somewhere around 2 AM and tosses and turns afterwards.  She has tried a weighted blanket, she denies any telltale symptoms of restless leg syndrome or any family history of sleep disturbances.  She is divorced, she lives alone, she has a dog in the household who sleeps in a kennel.  She is a non-smoker and drinks alcohol about 1/week and rare caffeine.  She had stopped taking her  Celexa earlier this year and has been off of it for months, she is supposed to restart it at 20 mg strength but has not done so yet. She has had vivid dreams.  She has thought about cognitive behavioral therapy but has not pursued it yet.  Her Past Medical History Is Significant For: Past Medical History:  Diagnosis Date  . Allergic rhinitis   . Anxiety   . Depression   . Genital herpes   . Hyperlipidemia   . Kidney stones     Her Past Surgical History Is Significant For: Past Surgical History:  Procedure Laterality Date  . BREAST EXCISIONAL BIOPSY Right   . BREAST LUMPECTOMY WITH RADIOACTIVE SEED LOCALIZATION Right 05/23/2015   Procedure: RIGHT BREAST LUMPECTOMY WITH RADIOACTIVE SEED LOCALIZATION;  Surgeon: Autumn Messing III, MD;  Location: [facility];  Service: General;  Laterality: Right;  . IVF     Egg retrieval Jan 2011  . WISDOM TOOTH EXTRACTION      Her Family History Is Significant For: Family History  Problem Relation Age of Onset  . Heart disease Father   . Diabetes Father   . Throat cancer Maternal Grandfather   . Multiple sclerosis Mother   . Lupus Mother   . Breast cancer Neg Hx     Her Social History Is Significant For: Social History   Socioeconomic History  . Marital status: Divorced    Spouse name: Not on file  . Number of children: 0  . Years of education: BA  .  Highest education level: Not on file  Occupational History  . 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 Financial resource strain: Not on file  . Food insecurity    Worry: Not on file    Inability: Not on file  . Transportation needs    Medical: Not on file    Non-medical: Not on file  Tobacco Use  . Smoking status: Never Smoker  . Smokeless tobacco: Never Used  Substance and Sexual Activity  . Alcohol use: Yes    Alcohol/week: 1.0 standard drinks    Types: 1 Standard drinks or equivalent per week    Comment: once a week  . Drug use: No     Frequency: 1.0 times per week  . Sexual activity: Yes    Birth control/protection: Pill  Lifestyle  . Physical activity    Days per week: Not on file    Minutes per session: Not on file  . Stress: Not on file  Relationships  . Social Musicianconnections    Talks on phone: Not on file    Gets together: Not on file    Attends religious service: Not on file    Active member of club or organization: Not on file    Attends meetings of clubs or organizations: Not on file    Relationship status: Not on file  Other Topics Concern  . Not on file  Social History Narrative   Pt is separated.  Pt is unemployed.   Caffeine use: very rare   Right handed     Her Allergies Are:  Allergies  Allergen Reactions  . Penicillins   :   Her Current Medications Are:  Outpatient Encounter Medications as of 09/07/2019  Medication Sig  . ALPRAZolam (XANAX) 0.25 MG tablet Take 1 tablet (0.25 mg total) by mouth 2 (two) times daily as needed. for anxiety  . fluticasone (FLONASE) 50 MCG/ACT nasal spray Place 1 spray into the nose daily.  Marland Kitchen. levonorgestrel (MIRENA) 20 MCG/24HR IUD 1 each by Intrauterine route once.  . Multiple Vitamin (MULTIVITAMIN) tablet Take 1 tablet by mouth daily.  . valACYclovir (VALTREX) 500 MG tablet Take 500 mg by mouth daily.    . citalopram (CELEXA) 20 MG tablet Take 1 tablet (20 mg total) by mouth daily. (Patient not taking: Reported on 09/07/2019)   No facility-administered encounter medications on file as of 09/07/2019.   :  Review of Systems:  Out of a complete 14 point review of systems, all are reviewed and negative with the exception of these symptoms as listed below: Review of Systems  Neurological:       RM 1. Internal referral from Ruthe Mannanalia Aron, MD for persistent disorder of initiating or maintaining sleep. Prior sleep study done 06/01/2011 which can be seen in Epic. Pt not on CPAP.     Objective:  Neurological Exam  Physical Exam Physical Examination:   Vitals:    09/07/19 1547  BP: 110/70  Pulse: 94  Temp: 97.8 F (36.6 C)  SpO2: 99%    General Examination: The patient is a very pleasant 48 y.o. female in no acute distress. She appears well-developed and well-nourished and well groomed.   HEENT: Normocephalic, atraumatic, pupils are equal, round and reactive to light, extraocular tracking is good without limitation to gaze excursion or nystagmus noted. Hearing is grossly intact. Face is symmetric with normal facial animation. Speech is clear with no dysarthria noted. There is no hypophonia. There is no lip, neck/head, jaw or voice  tremor. Neck is supple with full range of passive and active motion. There are no carotid bruits on auscultation. Oropharynx exam reveals: no mouth dryness, good dental hygiene and no significant airway crowding, slightly longer uvula. Mallampati is class I. Tongue protrudes centrally and palate elevates symmetrically. Tonsils are small. Neck size is 12 3/4 inches.    Chest: Clear to auscultation without wheezing, rhonchi or crackles noted.  Heart: S1+S2+0, regular and normal without murmurs, rubs or gallops noted.   Abdomen: Soft, non-tender and non-distended with normal bowel sounds appreciated on auscultation.  Extremities: There is no pitting edema in the distal lower extremities bilaterally.   Skin: Warm and dry without trophic changes noted.   Musculoskeletal: exam reveals no obvious joint deformities, tenderness or joint swelling or erythema.   Neurologically:  Mental status: The patient is awake, alert and oriented in all 4 spheres. Her immediate and remote memory, attention, language skills and fund of knowledge are appropriate. There is no evidence of aphasia, agnosia, apraxia or anomia. Speech is clear with normal prosody and enunciation. Thought process is linear. Mood is normal and affect is normal.  Cranial nerves II - XII are as described above under HEENT exam.  Motor exam: Normal bulk, strength and tone  is noted. There is no tremor, Romberg is neg. Fine motor skills and coordination: grossly intact.  Cerebellar testing: No dysmetria or intention tremor. There is no truncal or gait ataxia.  Sensory exam: intact to light touch in the upper and lower extremities.  Gait, station and balance: She stands easily. No veering to one side is noted. No leaning to one side is noted. Posture is age-appropriate and stance is narrow based. Gait shows normal stride length and normal pace. No problems turning are noted. Tandem walk is unremarkable.                Assessment and Plan:   In summary, Keyli Duross is a very pleasant 48 y.o.-year old female with an underlying history of hyperlipidemia, nephrolithiasis, allergic rhinitis, depression and anxiety, who Presents for evaluation of her chronic sleep difficulties.  She had a sleep study in 2012 which was negative for sleep apnea.  She has maintained her weight.  She has tried and failed over-the-counter and some prescription medications for sleep.  She is in the process of restarting Celexa for her depression and anxiety but would be willing to hold off until after sleep testing.  I suggested we proceed with a laboratory attended sleep study to look for an underlying organic cause of her sleep difficulties. For chronic insomnia, she may benefit from Cognitive behavioral therapy through a trained psychologist.  She is encouraged to talk to about it. We will proceed with sleep study testing.  In the event that she has obstructive sleep apnea, she is advised to consider AutoPap or CPAP therapy.  We talked about the importance of maintaining good sleep hygiene. I plan to see her back after her sleep test; we will also keep her posted by phone call. I answered all her questions today and she was in agreement.  Thank you very much for allowing me to participate in the care of this nice patient. If I can be of any further assistance to you please do not hesitate to call  me at [phone].  Sincerely,   Huston Foley, MD, PhD

## 2019-09-07 NOTE — Patient Instructions (Signed)
As discussed, we will proceed with a sleep study to look into an underlying organic cause of her sleep difficulties.As you know, your sleep study from 2012 did not show any obstructive sleep apnea or leg twitching.  Your chronic insomnia may respond to cognitive behavioral therapy (CBT-I) with a trained psychologist. Please talk to Dr. Deborra Medina about it.  For now, If you do not mind, please hold off on restarting the Celexa until after the sleep test.

## 2019-09-21 ENCOUNTER — Ambulatory Visit (INDEPENDENT_AMBULATORY_CARE_PROVIDER_SITE_OTHER): Payer: BC Managed Care – PPO | Admitting: Neurology

## 2019-09-21 DIAGNOSIS — G4719 Other hypersomnia: Secondary | ICD-10-CM

## 2019-09-21 DIAGNOSIS — R0683 Snoring: Secondary | ICD-10-CM

## 2019-09-21 DIAGNOSIS — G4700 Insomnia, unspecified: Secondary | ICD-10-CM

## 2019-09-21 DIAGNOSIS — G4710 Hypersomnia, unspecified: Secondary | ICD-10-CM | POA: Diagnosis not present

## 2019-09-21 DIAGNOSIS — R6889 Other general symptoms and signs: Secondary | ICD-10-CM

## 2019-09-21 DIAGNOSIS — G479 Sleep disorder, unspecified: Secondary | ICD-10-CM

## 2019-10-07 ENCOUNTER — Telehealth: Payer: Self-pay | Admitting: Neurology

## 2019-10-07 ENCOUNTER — Encounter: Payer: Self-pay | Admitting: Neurology

## 2019-10-07 NOTE — Progress Notes (Signed)
Patient referred by Dr. Deborra Medina, seen by me on 09/07/19 for chronic sleep difficulties, HST on 09/21/19.   Please call and notify the patient that the recent home sleep test did not show any significant obstructive sleep apnea. Some snoring was noted. For her chronic insomnia, I had suggested she talk to her PCP about a referral to psychology for CBT. She can follow up with the referring provider.  Thanks,  Star Age, MD, PhD [facility])

## 2019-10-07 NOTE — Procedures (Signed)
    Patient Information     First Name: Sara Last Name: Schultz ID: [id number]  Birth Date: 01/18/1971 Age: 48 Gender: Female  Referring Provider: Dr. Deborra Medina BMI: 20.1 (W=128 lb, H=5' 7'')  Neck Circ.:  50 ''  Sleep Study Information    Study Date: Sep 21, 2019 S/H/A Version: [id number] / 4.1.1528 / 22  History:    48 year old woman with a history of hyperlipidemia, nephrolithiasis, allergic rhinitis, depression and anxiety, who reports a longstanding history of difficulty maintaining sleep.  Summary & Diagnosis:     Primary snoring Recommendations:     This home sleep test does not demonstrate any significant obstructive or central sleep disordered breathing. Some snoring was noted and appeared to be intermittent, mostly in the mild range, at times moderate. Other causes of the patient's symptoms, including circadian rhythm disturbances, an underlying mood disorder, medication effect and/or an underlying medical problem cannot be ruled out based on this test. Clinical correlation is recommended. The patient should be cautioned not to drive, work at heights, or operate dangerous or heavy equipment when tired or sleepy. Review and reiteration of good sleep hygiene measures should be pursued with any patient. The patient can follow up with her referring provider, who will be notified of the test results.   I certify that I have reviewed the raw data recording prior to the issuance of this report in accordance with the standards of the American Academy of Sleep Medicine (AASM).  Star Age, MD, PhD Diplomat, ABPN (Neurology and Sleep)                  Sleep Summary    Oxygen Saturation Statistics     Start Study Time: End Study Time: Total Recording Time:          10:53:28 PM 6:01:34 AM 7 h, 8 min  Total Sleep Time % REM of Sleep Time:  5 h, 54 min  20.0    Mean: 96 Minimum: 94 Maximum: 100  Mean of Desaturations Nadirs (%):   95  Oxygen Desaturation. %: 4-9 10-20  >20 Total  Events Number Total  1 100.0  0 0.0  0 0.0  1 100.0  Oxygen Saturation: <90 <=88 <85 <80 <70  Duration (minutes): Sleep % 0.0 0.0 0.0 0.0 0.0 0.0 0.0 0.0 0.0 0.0     Respiratory Indices      Total Events REM NREM All Night  pRDI:  51  pAHI:  8 ODI:  1  pAHIc:  0  % CSR: 0.0 14.4 3.4 0.9 0.0 7.2 0.9 0.0 0.0 8.6 1.4 0.2 0.0       Pulse Rate Statistics during Sleep (BPM)      Mean: 73 Minimum: 54 Maximum: 107    Indices are calculated using technically valid sleep time of  5 h, 54 min. pRDI/pAHI are calculated using oxi desaturations ? 3%  Body Position Statistics  Position Supine Prone Right Left Non-Supine  Sleep (min) 139.5 5.0 57.0 153.1 215.1  Sleep % 39.3 1.4 16.1 43.2 60.7  pRDI 7.8 N/A 6.3 10.2 9.2  pAHI 0.9 N/A 1.1 2.0 1.7  ODI 0.4 N/A 0.0 0.0 0.0     Snoring Statistics Snoring Level (dB) >40 >50 >60 >70 >80 >Threshold (45)  Sleep (min) 72.5 3.9 1.1 0.3 0.0 8.6  Sleep % 20.4 1.1 0.3 0.1 0.0 2.4    Mean: 41 dB Sleep Stages Chart

## 2019-10-07 NOTE — Telephone Encounter (Signed)
Called patient to discuss sleep study results. No answer at this time. LVM for the patient to call back.  Will also send a mychart message.  

## 2019-10-07 NOTE — Telephone Encounter (Signed)
-----   Message from Star Age, MD sent at 10/07/2019  9:49 AM EST ----- Patient referred by Dr. Deborra Medina, seen by me on 09/07/19 for chronic sleep difficulties, HST on 09/21/19.   Please call and notify the patient that the recent home sleep test did not show any significant obstructive sleep apnea. Some snoring was noted. For her chronic insomnia, I had suggested she talk to her PCP about a referral to psychology for CBT. She can follow up with the referring provider.  Thanks,  Star Age, MD, PhD [facility])

## 2019-10-19 DIAGNOSIS — M9903 Segmental and somatic dysfunction of lumbar region: Secondary | ICD-10-CM | POA: Diagnosis not present

## 2019-10-19 DIAGNOSIS — M9901 Segmental and somatic dysfunction of cervical region: Secondary | ICD-10-CM | POA: Diagnosis not present

## 2019-10-19 DIAGNOSIS — M62830 Muscle spasm of back: Secondary | ICD-10-CM | POA: Diagnosis not present

## 2019-10-19 DIAGNOSIS — M545 Low back pain: Secondary | ICD-10-CM | POA: Diagnosis not present

## 2019-10-20 DIAGNOSIS — M9903 Segmental and somatic dysfunction of lumbar region: Secondary | ICD-10-CM | POA: Diagnosis not present

## 2019-10-20 DIAGNOSIS — M9901 Segmental and somatic dysfunction of cervical region: Secondary | ICD-10-CM | POA: Diagnosis not present

## 2019-10-20 DIAGNOSIS — M545 Low back pain: Secondary | ICD-10-CM | POA: Diagnosis not present

## 2019-10-20 DIAGNOSIS — M62830 Muscle spasm of back: Secondary | ICD-10-CM | POA: Diagnosis not present

## 2019-10-28 DIAGNOSIS — M62830 Muscle spasm of back: Secondary | ICD-10-CM | POA: Diagnosis not present

## 2019-10-28 DIAGNOSIS — M9903 Segmental and somatic dysfunction of lumbar region: Secondary | ICD-10-CM | POA: Diagnosis not present

## 2019-10-28 DIAGNOSIS — M545 Low back pain: Secondary | ICD-10-CM | POA: Diagnosis not present

## 2019-10-28 DIAGNOSIS — M9901 Segmental and somatic dysfunction of cervical region: Secondary | ICD-10-CM | POA: Diagnosis not present

## 2019-10-29 DIAGNOSIS — M545 Low back pain: Secondary | ICD-10-CM | POA: Diagnosis not present

## 2019-10-29 DIAGNOSIS — M9903 Segmental and somatic dysfunction of lumbar region: Secondary | ICD-10-CM | POA: Diagnosis not present

## 2019-10-29 DIAGNOSIS — M62830 Muscle spasm of back: Secondary | ICD-10-CM | POA: Diagnosis not present

## 2019-10-29 DIAGNOSIS — M9901 Segmental and somatic dysfunction of cervical region: Secondary | ICD-10-CM | POA: Diagnosis not present

## 2019-10-29 IMAGING — MG DIGITAL DIAGNOSTIC UNILATERAL RIGHT MAMMOGRAM WITH TOMO AND CAD
4 series · 4 of 12 positions shown · non-contrast
Comparison: Previous exam(s).

CLINICAL DATA: 47-year-old female recalled from screening mammogram
dated 04/11/2018 for possible right breast distortion. Of note, the
patient had an excisional biopsy in the central right breast for
complex sclerosing lesion in 5148.

EXAM:
DIGITAL DIAGNOSTIC UNILATERAL RIGHT MAMMOGRAM WITH CAD AND TOMO

[R MLO synth-2D]
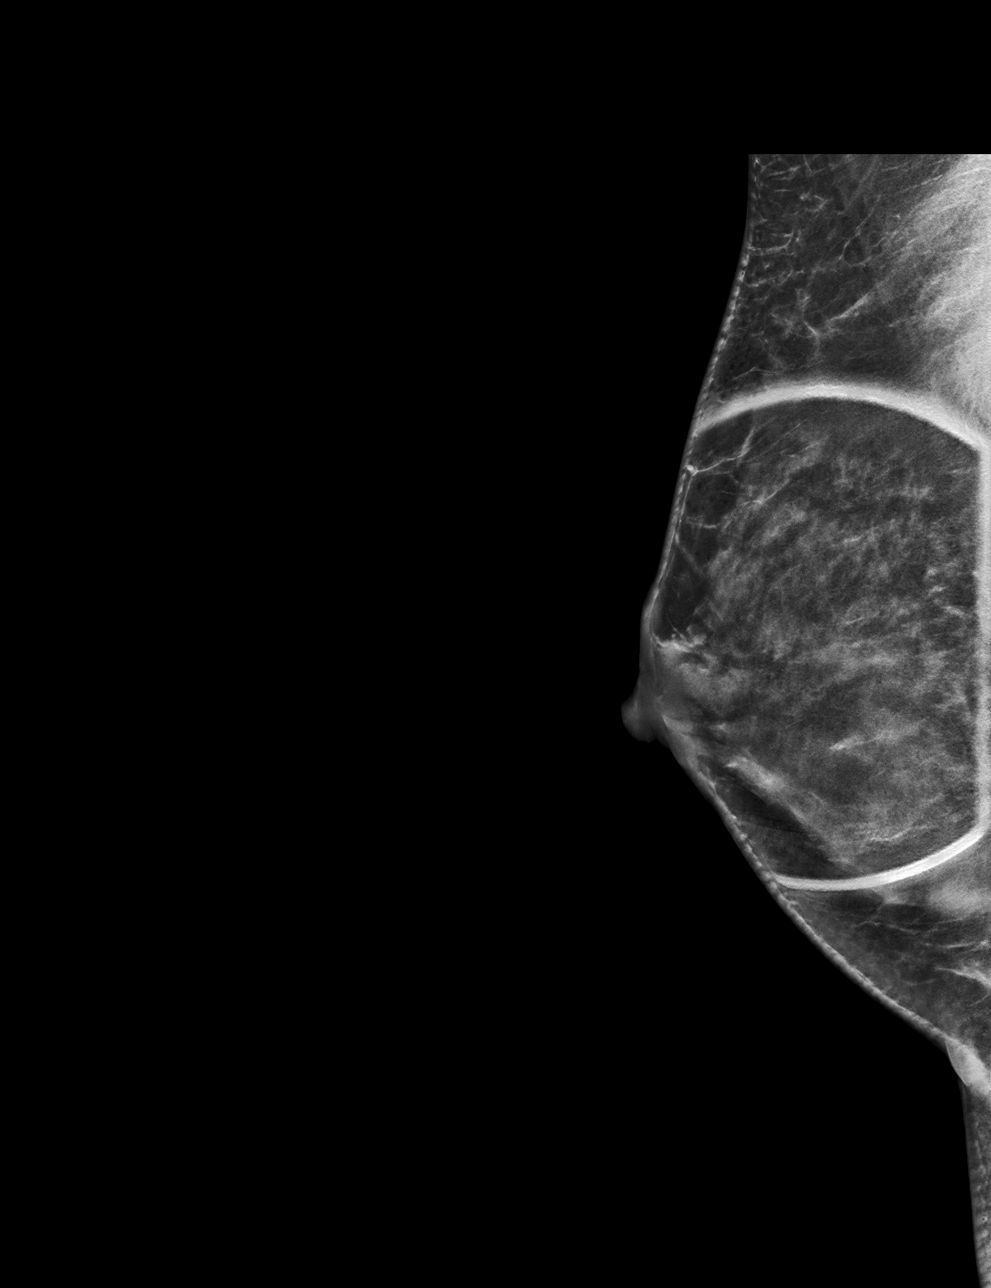

[R CC synth-2D]
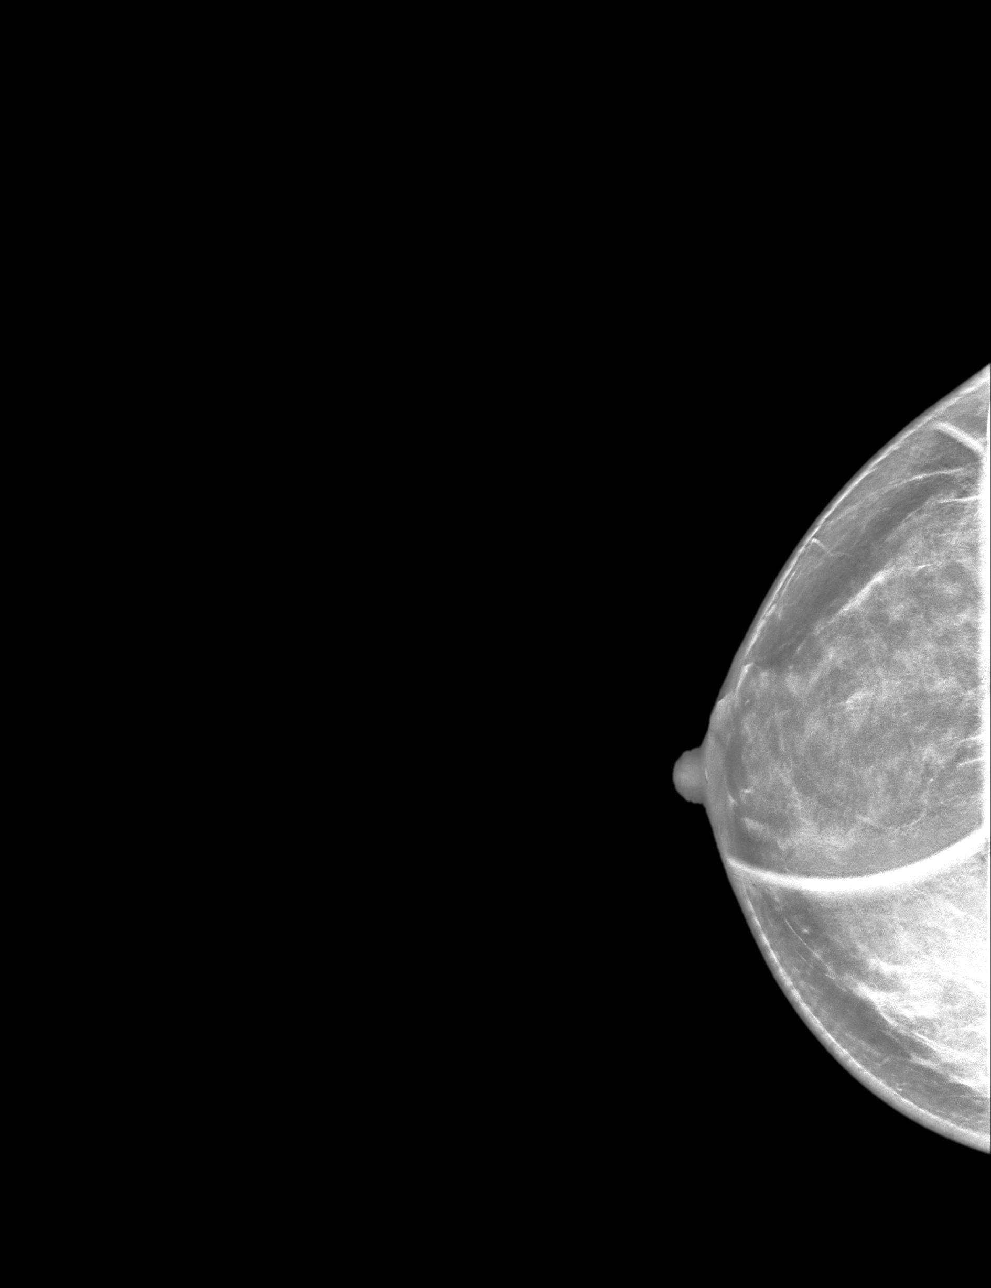

[R CC tomo · tomo slice 29/58.0]
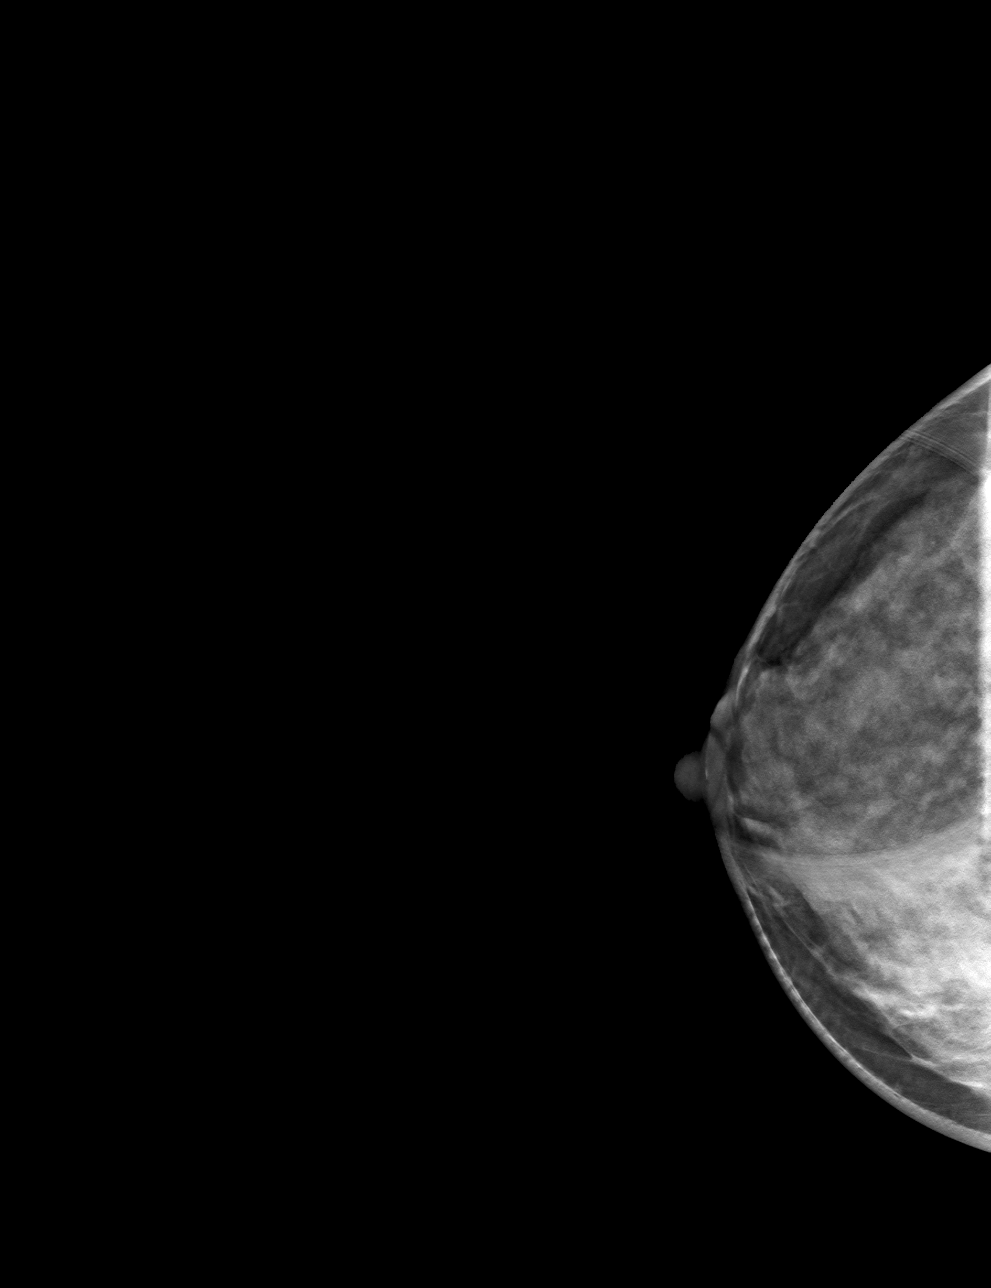

[R MLO tomo · tomo slice 33/66.0]
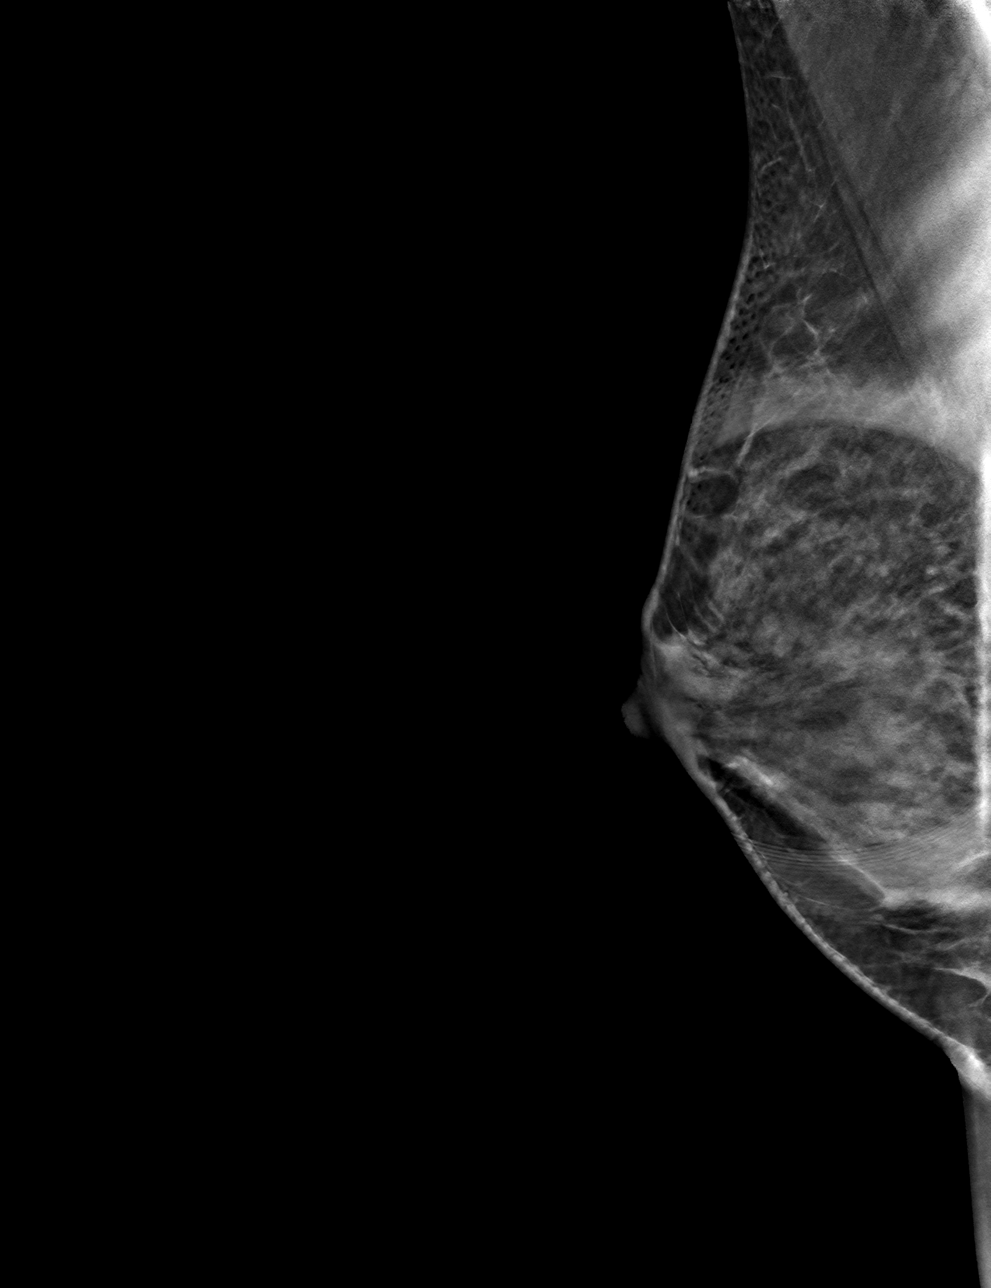

[4 of 12 positions shown; findings below may reference images not displayed]

ACR Breast Density Category c: The breast tissue is heterogeneously
dense, which may obscure small masses.
FINDINGS: Distortion in the superior central right breast at mid to posterior
depth is mammographically stable dating back to 5148. This is in the
region of the patient's excision for complex sclerosing lesion in
corresponds with overlying skin scar.

Mammographic images were processed with CAD.
IMPRESSION: Benign postsurgical changes of the right breast.

RECOMMENDATION:
Screening mammogram in one year.(Code:ZK-[id number])

I have discussed the findings and recommendations with the patient.
Results were also provided in writing at the conclusion of the
visit. If applicable, a reminder letter will be sent to the patient
regarding the next appointment.

BI-RADS CATEGORY  2: Benign.

## 2019-11-02 DIAGNOSIS — M545 Low back pain: Secondary | ICD-10-CM | POA: Diagnosis not present

## 2019-11-02 DIAGNOSIS — M9901 Segmental and somatic dysfunction of cervical region: Secondary | ICD-10-CM | POA: Diagnosis not present

## 2019-11-02 DIAGNOSIS — M9903 Segmental and somatic dysfunction of lumbar region: Secondary | ICD-10-CM | POA: Diagnosis not present

## 2019-11-02 DIAGNOSIS — M62830 Muscle spasm of back: Secondary | ICD-10-CM | POA: Diagnosis not present

## 2019-11-04 DIAGNOSIS — M9903 Segmental and somatic dysfunction of lumbar region: Secondary | ICD-10-CM | POA: Diagnosis not present

## 2019-11-04 DIAGNOSIS — M545 Low back pain: Secondary | ICD-10-CM | POA: Diagnosis not present

## 2019-11-04 DIAGNOSIS — M9901 Segmental and somatic dysfunction of cervical region: Secondary | ICD-10-CM | POA: Diagnosis not present

## 2019-11-04 DIAGNOSIS — M62830 Muscle spasm of back: Secondary | ICD-10-CM | POA: Diagnosis not present

## 2019-11-05 DIAGNOSIS — M545 Low back pain: Secondary | ICD-10-CM | POA: Diagnosis not present

## 2019-11-05 DIAGNOSIS — M62830 Muscle spasm of back: Secondary | ICD-10-CM | POA: Diagnosis not present

## 2019-11-05 DIAGNOSIS — M9903 Segmental and somatic dysfunction of lumbar region: Secondary | ICD-10-CM | POA: Diagnosis not present

## 2019-11-05 DIAGNOSIS — M9901 Segmental and somatic dysfunction of cervical region: Secondary | ICD-10-CM | POA: Diagnosis not present

## 2019-11-09 DIAGNOSIS — M545 Low back pain: Secondary | ICD-10-CM | POA: Diagnosis not present

## 2019-11-09 DIAGNOSIS — M62830 Muscle spasm of back: Secondary | ICD-10-CM | POA: Diagnosis not present

## 2019-11-09 DIAGNOSIS — M9901 Segmental and somatic dysfunction of cervical region: Secondary | ICD-10-CM | POA: Diagnosis not present

## 2019-11-09 DIAGNOSIS — M9903 Segmental and somatic dysfunction of lumbar region: Secondary | ICD-10-CM | POA: Diagnosis not present

## 2019-11-10 DIAGNOSIS — M9903 Segmental and somatic dysfunction of lumbar region: Secondary | ICD-10-CM | POA: Diagnosis not present

## 2019-11-10 DIAGNOSIS — M62830 Muscle spasm of back: Secondary | ICD-10-CM | POA: Diagnosis not present

## 2019-11-10 DIAGNOSIS — M545 Low back pain: Secondary | ICD-10-CM | POA: Diagnosis not present

## 2019-11-10 DIAGNOSIS — M9901 Segmental and somatic dysfunction of cervical region: Secondary | ICD-10-CM | POA: Diagnosis not present

## 2019-11-11 DIAGNOSIS — M9903 Segmental and somatic dysfunction of lumbar region: Secondary | ICD-10-CM | POA: Diagnosis not present

## 2019-11-11 DIAGNOSIS — M9901 Segmental and somatic dysfunction of cervical region: Secondary | ICD-10-CM | POA: Diagnosis not present

## 2019-11-11 DIAGNOSIS — M545 Low back pain: Secondary | ICD-10-CM | POA: Diagnosis not present

## 2019-11-11 DIAGNOSIS — M62830 Muscle spasm of back: Secondary | ICD-10-CM | POA: Diagnosis not present

## 2020-06-06 DIAGNOSIS — Z01419 Encounter for gynecological examination (general) (routine) without abnormal findings: Secondary | ICD-10-CM | POA: Diagnosis not present

## 2020-06-06 DIAGNOSIS — Z1231 Encounter for screening mammogram for malignant neoplasm of breast: Secondary | ICD-10-CM | POA: Diagnosis not present

## 2020-06-06 DIAGNOSIS — Z6820 Body mass index (BMI) 20.0-20.9, adult: Secondary | ICD-10-CM | POA: Diagnosis not present

## 2020-06-07 DIAGNOSIS — Z20822 Contact with and (suspected) exposure to covid-19: Secondary | ICD-10-CM | POA: Diagnosis not present

## 2020-06-09 DIAGNOSIS — Z130 Encounter for screening for diseases of the blood and blood-forming organs and certain disorders involving the immune mechanism: Secondary | ICD-10-CM | POA: Diagnosis not present

## 2020-06-09 DIAGNOSIS — Z131 Encounter for screening for diabetes mellitus: Secondary | ICD-10-CM | POA: Diagnosis not present

## 2020-06-09 DIAGNOSIS — Z13228 Encounter for screening for other metabolic disorders: Secondary | ICD-10-CM | POA: Diagnosis not present

## 2020-06-09 DIAGNOSIS — Z1329 Encounter for screening for other suspected endocrine disorder: Secondary | ICD-10-CM | POA: Diagnosis not present

## 2020-06-09 DIAGNOSIS — Z13220 Encounter for screening for lipoid disorders: Secondary | ICD-10-CM | POA: Diagnosis not present

## 2020-06-23 DIAGNOSIS — Z20822 Contact with and (suspected) exposure to covid-19: Secondary | ICD-10-CM | POA: Diagnosis not present

## 2020-07-14 DIAGNOSIS — Z86018 Personal history of other benign neoplasm: Secondary | ICD-10-CM | POA: Diagnosis not present

## 2020-07-14 DIAGNOSIS — D225 Melanocytic nevi of trunk: Secondary | ICD-10-CM | POA: Diagnosis not present

## 2020-07-14 DIAGNOSIS — L821 Other seborrheic keratosis: Secondary | ICD-10-CM | POA: Diagnosis not present

## 2020-07-14 DIAGNOSIS — L814 Other melanin hyperpigmentation: Secondary | ICD-10-CM | POA: Diagnosis not present

## 2020-08-01 DIAGNOSIS — M25522 Pain in left elbow: Secondary | ICD-10-CM | POA: Diagnosis not present

## 2020-08-05 DIAGNOSIS — M25522 Pain in left elbow: Secondary | ICD-10-CM | POA: Diagnosis not present

## 2020-08-09 DIAGNOSIS — M25522 Pain in left elbow: Secondary | ICD-10-CM | POA: Diagnosis not present

## 2020-08-11 DIAGNOSIS — M25522 Pain in left elbow: Secondary | ICD-10-CM | POA: Diagnosis not present

## 2020-10-20 DIAGNOSIS — Z03818 Encounter for observation for suspected exposure to other biological agents ruled out: Secondary | ICD-10-CM | POA: Diagnosis not present

## 2020-10-20 DIAGNOSIS — Z20822 Contact with and (suspected) exposure to covid-19: Secondary | ICD-10-CM | POA: Diagnosis not present

## 2020-12-06 DIAGNOSIS — F332 Major depressive disorder, recurrent severe without psychotic features: Secondary | ICD-10-CM | POA: Diagnosis not present

## 2020-12-16 DIAGNOSIS — Z20822 Contact with and (suspected) exposure to covid-19: Secondary | ICD-10-CM | POA: Diagnosis not present

## 2020-12-31 DIAGNOSIS — Z20822 Contact with and (suspected) exposure to covid-19: Secondary | ICD-10-CM | POA: Diagnosis not present

## 2021-06-29 DIAGNOSIS — Z01419 Encounter for gynecological examination (general) (routine) without abnormal findings: Secondary | ICD-10-CM | POA: Diagnosis not present

## 2021-06-29 DIAGNOSIS — Z1231 Encounter for screening mammogram for malignant neoplasm of breast: Secondary | ICD-10-CM | POA: Diagnosis not present

## 2021-06-29 DIAGNOSIS — Z681 Body mass index (BMI) 19 or less, adult: Secondary | ICD-10-CM | POA: Diagnosis not present

## 2021-06-29 DIAGNOSIS — B009 Herpesviral infection, unspecified: Secondary | ICD-10-CM | POA: Diagnosis not present

## 2021-06-29 DIAGNOSIS — N951 Menopausal and female climacteric states: Secondary | ICD-10-CM | POA: Diagnosis not present

## 2021-07-18 DIAGNOSIS — F331 Major depressive disorder, recurrent, moderate: Secondary | ICD-10-CM | POA: Diagnosis not present

## 2021-07-18 DIAGNOSIS — F410 Panic disorder [episodic paroxysmal anxiety] without agoraphobia: Secondary | ICD-10-CM | POA: Diagnosis not present

## 2021-07-27 DIAGNOSIS — Z86018 Personal history of other benign neoplasm: Secondary | ICD-10-CM | POA: Diagnosis not present

## 2021-07-27 DIAGNOSIS — L821 Other seborrheic keratosis: Secondary | ICD-10-CM | POA: Diagnosis not present

## 2021-07-27 DIAGNOSIS — L814 Other melanin hyperpigmentation: Secondary | ICD-10-CM | POA: Diagnosis not present

## 2021-07-27 DIAGNOSIS — D225 Melanocytic nevi of trunk: Secondary | ICD-10-CM | POA: Diagnosis not present

## 2021-08-02 DIAGNOSIS — K5900 Constipation, unspecified: Secondary | ICD-10-CM | POA: Diagnosis not present

## 2021-08-02 DIAGNOSIS — Z13220 Encounter for screening for lipoid disorders: Secondary | ICD-10-CM | POA: Diagnosis not present

## 2021-08-02 DIAGNOSIS — Z0000 Encounter for general adult medical examination without abnormal findings: Secondary | ICD-10-CM | POA: Diagnosis not present

## 2021-08-02 DIAGNOSIS — E559 Vitamin D deficiency, unspecified: Secondary | ICD-10-CM | POA: Diagnosis not present

## 2021-08-02 DIAGNOSIS — Z23 Encounter for immunization: Secondary | ICD-10-CM | POA: Diagnosis not present

## 2021-08-02 DIAGNOSIS — R5383 Other fatigue: Secondary | ICD-10-CM | POA: Diagnosis not present

## 2021-08-23 DIAGNOSIS — F3341 Major depressive disorder, recurrent, in partial remission: Secondary | ICD-10-CM | POA: Diagnosis not present

## 2021-08-23 DIAGNOSIS — F411 Generalized anxiety disorder: Secondary | ICD-10-CM | POA: Diagnosis not present

## 2021-11-23 DIAGNOSIS — F411 Generalized anxiety disorder: Secondary | ICD-10-CM | POA: Diagnosis not present

## 2021-11-23 DIAGNOSIS — F331 Major depressive disorder, recurrent, moderate: Secondary | ICD-10-CM | POA: Diagnosis not present

## 2021-11-23 DIAGNOSIS — F5101 Primary insomnia: Secondary | ICD-10-CM | POA: Diagnosis not present

## 2021-12-25 DIAGNOSIS — Z1211 Encounter for screening for malignant neoplasm of colon: Secondary | ICD-10-CM | POA: Diagnosis not present

## 2021-12-25 DIAGNOSIS — K644 Residual hemorrhoidal skin tags: Secondary | ICD-10-CM | POA: Diagnosis not present

## 2021-12-25 DIAGNOSIS — K648 Other hemorrhoids: Secondary | ICD-10-CM | POA: Diagnosis not present

## 2022-01-02 DIAGNOSIS — F332 Major depressive disorder, recurrent severe without psychotic features: Secondary | ICD-10-CM | POA: Diagnosis not present

## 2022-01-15 DIAGNOSIS — F4323 Adjustment disorder with mixed anxiety and depressed mood: Secondary | ICD-10-CM | POA: Diagnosis not present

## 2022-01-18 DIAGNOSIS — F331 Major depressive disorder, recurrent, moderate: Secondary | ICD-10-CM | POA: Diagnosis not present

## 2022-01-18 DIAGNOSIS — F5101 Primary insomnia: Secondary | ICD-10-CM | POA: Diagnosis not present

## 2022-01-18 DIAGNOSIS — F411 Generalized anxiety disorder: Secondary | ICD-10-CM | POA: Diagnosis not present

## 2022-02-05 DIAGNOSIS — F4323 Adjustment disorder with mixed anxiety and depressed mood: Secondary | ICD-10-CM | POA: Diagnosis not present

## 2022-02-12 DIAGNOSIS — F4323 Adjustment disorder with mixed anxiety and depressed mood: Secondary | ICD-10-CM | POA: Diagnosis not present

## 2022-02-13 DIAGNOSIS — F332 Major depressive disorder, recurrent severe without psychotic features: Secondary | ICD-10-CM | POA: Diagnosis not present

## 2022-02-19 DIAGNOSIS — F4323 Adjustment disorder with mixed anxiety and depressed mood: Secondary | ICD-10-CM | POA: Diagnosis not present

## 2022-02-20 DIAGNOSIS — F332 Major depressive disorder, recurrent severe without psychotic features: Secondary | ICD-10-CM | POA: Diagnosis not present

## 2022-02-23 DIAGNOSIS — F332 Major depressive disorder, recurrent severe without psychotic features: Secondary | ICD-10-CM | POA: Diagnosis not present

## 2022-02-26 DIAGNOSIS — F4323 Adjustment disorder with mixed anxiety and depressed mood: Secondary | ICD-10-CM | POA: Diagnosis not present

## 2022-02-28 DIAGNOSIS — F332 Major depressive disorder, recurrent severe without psychotic features: Secondary | ICD-10-CM | POA: Diagnosis not present

## 2022-03-06 DIAGNOSIS — F332 Major depressive disorder, recurrent severe without psychotic features: Secondary | ICD-10-CM | POA: Diagnosis not present

## 2022-03-07 DIAGNOSIS — F332 Major depressive disorder, recurrent severe without psychotic features: Secondary | ICD-10-CM | POA: Diagnosis not present

## 2022-03-09 DIAGNOSIS — F332 Major depressive disorder, recurrent severe without psychotic features: Secondary | ICD-10-CM | POA: Diagnosis not present

## 2022-03-12 DIAGNOSIS — F4323 Adjustment disorder with mixed anxiety and depressed mood: Secondary | ICD-10-CM | POA: Diagnosis not present

## 2022-03-13 DIAGNOSIS — F332 Major depressive disorder, recurrent severe without psychotic features: Secondary | ICD-10-CM | POA: Diagnosis not present

## 2022-03-16 DIAGNOSIS — F332 Major depressive disorder, recurrent severe without psychotic features: Secondary | ICD-10-CM | POA: Diagnosis not present

## 2022-03-19 DIAGNOSIS — F332 Major depressive disorder, recurrent severe without psychotic features: Secondary | ICD-10-CM | POA: Diagnosis not present

## 2022-03-19 DIAGNOSIS — F4323 Adjustment disorder with mixed anxiety and depressed mood: Secondary | ICD-10-CM | POA: Diagnosis not present

## 2022-03-20 DIAGNOSIS — F332 Major depressive disorder, recurrent severe without psychotic features: Secondary | ICD-10-CM | POA: Diagnosis not present

## 2022-03-20 DIAGNOSIS — F3341 Major depressive disorder, recurrent, in partial remission: Secondary | ICD-10-CM | POA: Diagnosis not present

## 2022-03-23 DIAGNOSIS — F332 Major depressive disorder, recurrent severe without psychotic features: Secondary | ICD-10-CM | POA: Diagnosis not present

## 2022-03-25 DIAGNOSIS — F332 Major depressive disorder, recurrent severe without psychotic features: Secondary | ICD-10-CM | POA: Diagnosis not present

## 2022-03-26 DIAGNOSIS — F332 Major depressive disorder, recurrent severe without psychotic features: Secondary | ICD-10-CM | POA: Diagnosis not present

## 2022-03-28 DIAGNOSIS — F4323 Adjustment disorder with mixed anxiety and depressed mood: Secondary | ICD-10-CM | POA: Diagnosis not present

## 2022-04-01 DIAGNOSIS — F332 Major depressive disorder, recurrent severe without psychotic features: Secondary | ICD-10-CM | POA: Diagnosis not present

## 2022-04-02 DIAGNOSIS — F4323 Adjustment disorder with mixed anxiety and depressed mood: Secondary | ICD-10-CM | POA: Diagnosis not present

## 2022-04-03 DIAGNOSIS — F332 Major depressive disorder, recurrent severe without psychotic features: Secondary | ICD-10-CM | POA: Diagnosis not present

## 2022-04-06 DIAGNOSIS — F332 Major depressive disorder, recurrent severe without psychotic features: Secondary | ICD-10-CM | POA: Diagnosis not present

## 2022-04-06 DIAGNOSIS — M25551 Pain in right hip: Secondary | ICD-10-CM | POA: Diagnosis not present

## 2022-04-06 DIAGNOSIS — M25552 Pain in left hip: Secondary | ICD-10-CM | POA: Diagnosis not present

## 2022-04-09 DIAGNOSIS — F332 Major depressive disorder, recurrent severe without psychotic features: Secondary | ICD-10-CM | POA: Diagnosis not present

## 2022-04-12 DIAGNOSIS — F332 Major depressive disorder, recurrent severe without psychotic features: Secondary | ICD-10-CM | POA: Diagnosis not present

## 2022-04-18 DIAGNOSIS — F332 Major depressive disorder, recurrent severe without psychotic features: Secondary | ICD-10-CM | POA: Diagnosis not present

## 2022-04-23 DIAGNOSIS — F4323 Adjustment disorder with mixed anxiety and depressed mood: Secondary | ICD-10-CM | POA: Diagnosis not present

## 2022-04-24 DIAGNOSIS — F332 Major depressive disorder, recurrent severe without psychotic features: Secondary | ICD-10-CM | POA: Diagnosis not present

## 2022-04-27 DIAGNOSIS — F332 Major depressive disorder, recurrent severe without psychotic features: Secondary | ICD-10-CM | POA: Diagnosis not present

## 2022-04-30 DIAGNOSIS — F332 Major depressive disorder, recurrent severe without psychotic features: Secondary | ICD-10-CM | POA: Diagnosis not present

## 2022-05-03 DIAGNOSIS — F332 Major depressive disorder, recurrent severe without psychotic features: Secondary | ICD-10-CM | POA: Diagnosis not present

## 2022-05-07 DIAGNOSIS — F332 Major depressive disorder, recurrent severe without psychotic features: Secondary | ICD-10-CM | POA: Diagnosis not present

## 2022-05-08 DIAGNOSIS — F332 Major depressive disorder, recurrent severe without psychotic features: Secondary | ICD-10-CM | POA: Diagnosis not present

## 2022-05-09 DIAGNOSIS — F4323 Adjustment disorder with mixed anxiety and depressed mood: Secondary | ICD-10-CM | POA: Diagnosis not present

## 2022-05-10 DIAGNOSIS — F332 Major depressive disorder, recurrent severe without psychotic features: Secondary | ICD-10-CM | POA: Diagnosis not present

## 2022-05-14 DIAGNOSIS — F332 Major depressive disorder, recurrent severe without psychotic features: Secondary | ICD-10-CM | POA: Diagnosis not present

## 2022-05-21 DIAGNOSIS — F4323 Adjustment disorder with mixed anxiety and depressed mood: Secondary | ICD-10-CM | POA: Diagnosis not present

## 2022-05-22 DIAGNOSIS — F332 Major depressive disorder, recurrent severe without psychotic features: Secondary | ICD-10-CM | POA: Diagnosis not present

## 2022-05-25 DIAGNOSIS — M25552 Pain in left hip: Secondary | ICD-10-CM | POA: Diagnosis not present

## 2022-05-25 DIAGNOSIS — M7611 Psoas tendinitis, right hip: Secondary | ICD-10-CM | POA: Diagnosis not present

## 2022-05-25 DIAGNOSIS — M7612 Psoas tendinitis, left hip: Secondary | ICD-10-CM | POA: Diagnosis not present

## 2022-05-25 DIAGNOSIS — M25551 Pain in right hip: Secondary | ICD-10-CM | POA: Diagnosis not present

## 2022-05-29 DIAGNOSIS — F332 Major depressive disorder, recurrent severe without psychotic features: Secondary | ICD-10-CM | POA: Diagnosis not present

## 2022-06-04 DIAGNOSIS — F4323 Adjustment disorder with mixed anxiety and depressed mood: Secondary | ICD-10-CM | POA: Diagnosis not present

## 2022-06-05 DIAGNOSIS — F332 Major depressive disorder, recurrent severe without psychotic features: Secondary | ICD-10-CM | POA: Diagnosis not present

## 2022-06-12 DIAGNOSIS — F332 Major depressive disorder, recurrent severe without psychotic features: Secondary | ICD-10-CM | POA: Diagnosis not present

## 2022-06-13 DIAGNOSIS — F332 Major depressive disorder, recurrent severe without psychotic features: Secondary | ICD-10-CM | POA: Diagnosis not present

## 2022-06-18 DIAGNOSIS — F4323 Adjustment disorder with mixed anxiety and depressed mood: Secondary | ICD-10-CM | POA: Diagnosis not present

## 2022-06-19 DIAGNOSIS — F332 Major depressive disorder, recurrent severe without psychotic features: Secondary | ICD-10-CM | POA: Diagnosis not present

## 2022-06-20 DIAGNOSIS — F3342 Major depressive disorder, recurrent, in full remission: Secondary | ICD-10-CM | POA: Diagnosis not present

## 2022-06-20 DIAGNOSIS — M25551 Pain in right hip: Secondary | ICD-10-CM | POA: Diagnosis not present

## 2022-06-20 DIAGNOSIS — M25552 Pain in left hip: Secondary | ICD-10-CM | POA: Diagnosis not present

## 2022-07-02 DIAGNOSIS — F4323 Adjustment disorder with mixed anxiety and depressed mood: Secondary | ICD-10-CM | POA: Diagnosis not present

## 2022-07-06 DIAGNOSIS — Z6820 Body mass index (BMI) 20.0-20.9, adult: Secondary | ICD-10-CM | POA: Diagnosis not present

## 2022-07-06 DIAGNOSIS — N760 Acute vaginitis: Secondary | ICD-10-CM | POA: Diagnosis not present

## 2022-07-06 DIAGNOSIS — Z01419 Encounter for gynecological examination (general) (routine) without abnormal findings: Secondary | ICD-10-CM | POA: Diagnosis not present

## 2022-07-06 DIAGNOSIS — Z1231 Encounter for screening mammogram for malignant neoplasm of breast: Secondary | ICD-10-CM | POA: Diagnosis not present

## 2022-07-16 DIAGNOSIS — F4323 Adjustment disorder with mixed anxiety and depressed mood: Secondary | ICD-10-CM | POA: Diagnosis not present

## 2022-07-25 DIAGNOSIS — F4323 Adjustment disorder with mixed anxiety and depressed mood: Secondary | ICD-10-CM | POA: Diagnosis not present

## 2022-08-02 DIAGNOSIS — F4323 Adjustment disorder with mixed anxiety and depressed mood: Secondary | ICD-10-CM | POA: Diagnosis not present

## 2022-08-03 DIAGNOSIS — F332 Major depressive disorder, recurrent severe without psychotic features: Secondary | ICD-10-CM | POA: Diagnosis not present

## 2022-08-08 DIAGNOSIS — F332 Major depressive disorder, recurrent severe without psychotic features: Secondary | ICD-10-CM | POA: Diagnosis not present

## 2022-08-13 DIAGNOSIS — D225 Melanocytic nevi of trunk: Secondary | ICD-10-CM | POA: Diagnosis not present

## 2022-08-13 DIAGNOSIS — D2271 Melanocytic nevi of right lower limb, including hip: Secondary | ICD-10-CM | POA: Diagnosis not present

## 2022-08-13 DIAGNOSIS — L821 Other seborrheic keratosis: Secondary | ICD-10-CM | POA: Diagnosis not present

## 2022-08-13 DIAGNOSIS — L814 Other melanin hyperpigmentation: Secondary | ICD-10-CM | POA: Diagnosis not present

## 2022-08-15 DIAGNOSIS — F4323 Adjustment disorder with mixed anxiety and depressed mood: Secondary | ICD-10-CM | POA: Diagnosis not present

## 2022-08-16 DIAGNOSIS — F332 Major depressive disorder, recurrent severe without psychotic features: Secondary | ICD-10-CM | POA: Diagnosis not present

## 2022-08-17 DIAGNOSIS — Z79899 Other long term (current) drug therapy: Secondary | ICD-10-CM | POA: Diagnosis not present

## 2022-08-17 DIAGNOSIS — E7800 Pure hypercholesterolemia, unspecified: Secondary | ICD-10-CM | POA: Diagnosis not present

## 2022-08-17 DIAGNOSIS — E559 Vitamin D deficiency, unspecified: Secondary | ICD-10-CM | POA: Diagnosis not present

## 2022-08-21 DIAGNOSIS — F332 Major depressive disorder, recurrent severe without psychotic features: Secondary | ICD-10-CM | POA: Diagnosis not present

## 2022-08-22 DIAGNOSIS — Z23 Encounter for immunization: Secondary | ICD-10-CM | POA: Diagnosis not present

## 2022-08-22 DIAGNOSIS — Z0000 Encounter for general adult medical examination without abnormal findings: Secondary | ICD-10-CM | POA: Diagnosis not present

## 2022-08-23 DIAGNOSIS — F4323 Adjustment disorder with mixed anxiety and depressed mood: Secondary | ICD-10-CM | POA: Diagnosis not present

## 2022-08-28 DIAGNOSIS — F332 Major depressive disorder, recurrent severe without psychotic features: Secondary | ICD-10-CM | POA: Diagnosis not present

## 2022-09-04 DIAGNOSIS — F332 Major depressive disorder, recurrent severe without psychotic features: Secondary | ICD-10-CM | POA: Diagnosis not present

## 2022-09-06 DIAGNOSIS — F4323 Adjustment disorder with mixed anxiety and depressed mood: Secondary | ICD-10-CM | POA: Diagnosis not present

## 2022-09-11 DIAGNOSIS — F332 Major depressive disorder, recurrent severe without psychotic features: Secondary | ICD-10-CM | POA: Diagnosis not present

## 2022-09-13 DIAGNOSIS — F4323 Adjustment disorder with mixed anxiety and depressed mood: Secondary | ICD-10-CM | POA: Diagnosis not present

## 2022-09-20 DIAGNOSIS — F4323 Adjustment disorder with mixed anxiety and depressed mood: Secondary | ICD-10-CM | POA: Diagnosis not present

## 2022-09-25 DIAGNOSIS — F332 Major depressive disorder, recurrent severe without psychotic features: Secondary | ICD-10-CM | POA: Diagnosis not present

## 2022-09-27 DIAGNOSIS — F4323 Adjustment disorder with mixed anxiety and depressed mood: Secondary | ICD-10-CM | POA: Diagnosis not present

## 2022-10-18 DIAGNOSIS — N951 Menopausal and female climacteric states: Secondary | ICD-10-CM | POA: Diagnosis not present

## 2022-10-24 ENCOUNTER — Other Ambulatory Visit: Payer: Self-pay | Admitting: Gastroenterology

## 2022-10-24 DIAGNOSIS — R1013 Epigastric pain: Secondary | ICD-10-CM

## 2022-10-24 DIAGNOSIS — R634 Abnormal weight loss: Secondary | ICD-10-CM

## 2022-10-24 DIAGNOSIS — R110 Nausea: Secondary | ICD-10-CM

## 2022-10-25 DIAGNOSIS — F4323 Adjustment disorder with mixed anxiety and depressed mood: Secondary | ICD-10-CM | POA: Diagnosis not present

## 2022-11-01 ENCOUNTER — Ambulatory Visit
Admission: RE | Admit: 2022-11-01 | Discharge: 2022-11-01 | Disposition: A | Discharge status: Home / Self Care | Payer: BC Managed Care – PPO | Source: Ambulatory Visit | Attending: Gastroenterology | Admitting: Gastroenterology

## 2022-11-01 DIAGNOSIS — R634 Abnormal weight loss: Secondary | ICD-10-CM

## 2022-11-01 DIAGNOSIS — R110 Nausea: Secondary | ICD-10-CM

## 2022-11-01 DIAGNOSIS — R1013 Epigastric pain: Secondary | ICD-10-CM

## 2022-11-01 DIAGNOSIS — N200 Calculus of kidney: Secondary | ICD-10-CM | POA: Diagnosis not present

## 2022-11-01 MED ORDER — IOPAMIDOL (ISOVUE-300) INJECTION 61%
80.0000 mL | Freq: Once | INTRAVENOUS | Status: AC | PRN
  Administered 2022-11-01: 80 mL via INTRAVENOUS

## 2022-11-06 DIAGNOSIS — D259 Leiomyoma of uterus, unspecified: Secondary | ICD-10-CM | POA: Diagnosis not present

## 2022-11-21 DIAGNOSIS — K31A15 Gastric intestinal metaplasia without dysplasia, involving multiple sites: Secondary | ICD-10-CM | POA: Diagnosis not present

## 2022-11-21 DIAGNOSIS — K2289 Other specified disease of esophagus: Secondary | ICD-10-CM | POA: Diagnosis not present

## 2022-11-21 DIAGNOSIS — K2930 Chronic superficial gastritis without bleeding: Secondary | ICD-10-CM | POA: Diagnosis not present

## 2022-11-21 DIAGNOSIS — K2980 Duodenitis without bleeding: Secondary | ICD-10-CM | POA: Diagnosis not present

## 2022-11-21 DIAGNOSIS — R634 Abnormal weight loss: Secondary | ICD-10-CM | POA: Diagnosis not present

## 2022-11-21 DIAGNOSIS — R1013 Epigastric pain: Secondary | ICD-10-CM | POA: Diagnosis not present

## 2022-11-21 DIAGNOSIS — K2940 Chronic atrophic gastritis without bleeding: Secondary | ICD-10-CM | POA: Diagnosis not present

## 2022-11-21 DIAGNOSIS — R110 Nausea: Secondary | ICD-10-CM | POA: Diagnosis not present

## 2022-12-06 DIAGNOSIS — F4323 Adjustment disorder with mixed anxiety and depressed mood: Secondary | ICD-10-CM | POA: Diagnosis not present

## 2022-12-18 DIAGNOSIS — F4323 Adjustment disorder with mixed anxiety and depressed mood: Secondary | ICD-10-CM | POA: Diagnosis not present

## 2022-12-19 DIAGNOSIS — F419 Anxiety disorder, unspecified: Secondary | ICD-10-CM | POA: Diagnosis not present

## 2022-12-19 DIAGNOSIS — Z1329 Encounter for screening for other suspected endocrine disorder: Secondary | ICD-10-CM | POA: Diagnosis not present

## 2022-12-19 DIAGNOSIS — E559 Vitamin D deficiency, unspecified: Secondary | ICD-10-CM | POA: Diagnosis not present

## 2022-12-19 DIAGNOSIS — R5383 Other fatigue: Secondary | ICD-10-CM | POA: Diagnosis not present

## 2022-12-19 DIAGNOSIS — Z131 Encounter for screening for diabetes mellitus: Secondary | ICD-10-CM | POA: Diagnosis not present

## 2022-12-19 DIAGNOSIS — K581 Irritable bowel syndrome with constipation: Secondary | ICD-10-CM | POA: Diagnosis not present

## 2022-12-19 DIAGNOSIS — E611 Iron deficiency: Secondary | ICD-10-CM | POA: Diagnosis not present

## 2022-12-19 DIAGNOSIS — E039 Hypothyroidism, unspecified: Secondary | ICD-10-CM | POA: Diagnosis not present

## 2022-12-19 DIAGNOSIS — N959 Unspecified menopausal and perimenopausal disorder: Secondary | ICD-10-CM | POA: Diagnosis not present

## 2022-12-19 DIAGNOSIS — E538 Deficiency of other specified B group vitamins: Secondary | ICD-10-CM | POA: Diagnosis not present

## 2022-12-19 DIAGNOSIS — F32A Depression, unspecified: Secondary | ICD-10-CM | POA: Diagnosis not present

## 2022-12-27 DIAGNOSIS — F4323 Adjustment disorder with mixed anxiety and depressed mood: Secondary | ICD-10-CM | POA: Diagnosis not present

## 2023-01-03 DIAGNOSIS — F4323 Adjustment disorder with mixed anxiety and depressed mood: Secondary | ICD-10-CM | POA: Diagnosis not present

## 2023-01-10 DIAGNOSIS — F4323 Adjustment disorder with mixed anxiety and depressed mood: Secondary | ICD-10-CM | POA: Diagnosis not present

## 2023-01-18 DIAGNOSIS — Z79890 Hormone replacement therapy (postmenopausal): Secondary | ICD-10-CM | POA: Diagnosis not present

## 2023-01-29 DIAGNOSIS — Z1329 Encounter for screening for other suspected endocrine disorder: Secondary | ICD-10-CM | POA: Diagnosis not present

## 2023-01-29 DIAGNOSIS — E611 Iron deficiency: Secondary | ICD-10-CM | POA: Diagnosis not present

## 2023-01-29 DIAGNOSIS — N959 Unspecified menopausal and perimenopausal disorder: Secondary | ICD-10-CM | POA: Diagnosis not present

## 2023-01-29 DIAGNOSIS — R5383 Other fatigue: Secondary | ICD-10-CM | POA: Diagnosis not present

## 2023-01-31 DIAGNOSIS — F4323 Adjustment disorder with mixed anxiety and depressed mood: Secondary | ICD-10-CM | POA: Diagnosis not present

## 2023-02-20 DIAGNOSIS — F4323 Adjustment disorder with mixed anxiety and depressed mood: Secondary | ICD-10-CM | POA: Diagnosis not present

## 2023-02-27 DIAGNOSIS — F4323 Adjustment disorder with mixed anxiety and depressed mood: Secondary | ICD-10-CM | POA: Diagnosis not present

## 2023-03-15 DIAGNOSIS — F411 Generalized anxiety disorder: Secondary | ICD-10-CM | POA: Diagnosis not present

## 2023-03-15 DIAGNOSIS — F3342 Major depressive disorder, recurrent, in full remission: Secondary | ICD-10-CM | POA: Diagnosis not present

## 2023-03-18 DIAGNOSIS — K2950 Unspecified chronic gastritis without bleeding: Secondary | ICD-10-CM | POA: Diagnosis not present

## 2023-03-20 DIAGNOSIS — F4323 Adjustment disorder with mixed anxiety and depressed mood: Secondary | ICD-10-CM | POA: Diagnosis not present

## 2023-03-27 DIAGNOSIS — F4323 Adjustment disorder with mixed anxiety and depressed mood: Secondary | ICD-10-CM | POA: Diagnosis not present

## 2023-04-03 DIAGNOSIS — F4323 Adjustment disorder with mixed anxiety and depressed mood: Secondary | ICD-10-CM | POA: Diagnosis not present

## 2023-04-16 DIAGNOSIS — S30861A Insect bite (nonvenomous) of abdominal wall, initial encounter: Secondary | ICD-10-CM | POA: Diagnosis not present

## 2023-04-16 DIAGNOSIS — L539 Erythematous condition, unspecified: Secondary | ICD-10-CM | POA: Diagnosis not present

## 2023-04-16 DIAGNOSIS — T7840XA Allergy, unspecified, initial encounter: Secondary | ICD-10-CM | POA: Diagnosis not present

## 2023-04-17 DIAGNOSIS — F4323 Adjustment disorder with mixed anxiety and depressed mood: Secondary | ICD-10-CM | POA: Diagnosis not present

## 2023-04-18 DIAGNOSIS — Z1329 Encounter for screening for other suspected endocrine disorder: Secondary | ICD-10-CM | POA: Diagnosis not present

## 2023-04-18 DIAGNOSIS — E538 Deficiency of other specified B group vitamins: Secondary | ICD-10-CM | POA: Diagnosis not present

## 2023-04-18 DIAGNOSIS — N959 Unspecified menopausal and perimenopausal disorder: Secondary | ICD-10-CM | POA: Diagnosis not present

## 2023-04-18 DIAGNOSIS — E611 Iron deficiency: Secondary | ICD-10-CM | POA: Diagnosis not present

## 2023-04-18 DIAGNOSIS — E039 Hypothyroidism, unspecified: Secondary | ICD-10-CM | POA: Diagnosis not present

## 2023-04-18 DIAGNOSIS — K581 Irritable bowel syndrome with constipation: Secondary | ICD-10-CM | POA: Diagnosis not present

## 2023-04-18 DIAGNOSIS — R5383 Other fatigue: Secondary | ICD-10-CM | POA: Diagnosis not present

## 2023-04-18 DIAGNOSIS — E559 Vitamin D deficiency, unspecified: Secondary | ICD-10-CM | POA: Diagnosis not present

## 2023-04-24 DIAGNOSIS — F4323 Adjustment disorder with mixed anxiety and depressed mood: Secondary | ICD-10-CM | POA: Diagnosis not present

## 2023-04-30 DIAGNOSIS — L723 Sebaceous cyst: Secondary | ICD-10-CM | POA: Diagnosis not present

## 2023-05-02 DIAGNOSIS — R5383 Other fatigue: Secondary | ICD-10-CM | POA: Diagnosis not present

## 2023-05-02 DIAGNOSIS — N959 Unspecified menopausal and perimenopausal disorder: Secondary | ICD-10-CM | POA: Diagnosis not present

## 2023-05-02 DIAGNOSIS — E611 Iron deficiency: Secondary | ICD-10-CM | POA: Diagnosis not present

## 2023-05-02 DIAGNOSIS — K581 Irritable bowel syndrome with constipation: Secondary | ICD-10-CM | POA: Diagnosis not present

## 2023-05-08 DIAGNOSIS — F4323 Adjustment disorder with mixed anxiety and depressed mood: Secondary | ICD-10-CM | POA: Diagnosis not present

## 2023-05-29 DIAGNOSIS — F4323 Adjustment disorder with mixed anxiety and depressed mood: Secondary | ICD-10-CM | POA: Diagnosis not present

## 2023-06-05 DIAGNOSIS — F4323 Adjustment disorder with mixed anxiety and depressed mood: Secondary | ICD-10-CM | POA: Diagnosis not present

## 2023-06-19 DIAGNOSIS — F4323 Adjustment disorder with mixed anxiety and depressed mood: Secondary | ICD-10-CM | POA: Diagnosis not present

## 2023-06-25 DIAGNOSIS — L720 Epidermal cyst: Secondary | ICD-10-CM | POA: Diagnosis not present

## 2023-06-25 DIAGNOSIS — L723 Sebaceous cyst: Secondary | ICD-10-CM | POA: Diagnosis not present

## 2023-06-26 DIAGNOSIS — F4323 Adjustment disorder with mixed anxiety and depressed mood: Secondary | ICD-10-CM | POA: Diagnosis not present

## 2023-06-27 DIAGNOSIS — M542 Cervicalgia: Secondary | ICD-10-CM | POA: Diagnosis not present

## 2023-07-04 DIAGNOSIS — F4323 Adjustment disorder with mixed anxiety and depressed mood: Secondary | ICD-10-CM | POA: Diagnosis not present

## 2023-07-11 DIAGNOSIS — M47812 Spondylosis without myelopathy or radiculopathy, cervical region: Secondary | ICD-10-CM | POA: Diagnosis not present

## 2023-07-11 DIAGNOSIS — M5412 Radiculopathy, cervical region: Secondary | ICD-10-CM | POA: Diagnosis not present

## 2023-07-17 DIAGNOSIS — F4323 Adjustment disorder with mixed anxiety and depressed mood: Secondary | ICD-10-CM | POA: Diagnosis not present

## 2023-07-18 DIAGNOSIS — K581 Irritable bowel syndrome with constipation: Secondary | ICD-10-CM | POA: Diagnosis not present

## 2023-07-18 DIAGNOSIS — Z1329 Encounter for screening for other suspected endocrine disorder: Secondary | ICD-10-CM | POA: Diagnosis not present

## 2023-07-18 DIAGNOSIS — N959 Unspecified menopausal and perimenopausal disorder: Secondary | ICD-10-CM | POA: Diagnosis not present

## 2023-07-18 DIAGNOSIS — E611 Iron deficiency: Secondary | ICD-10-CM | POA: Diagnosis not present

## 2023-07-18 DIAGNOSIS — F419 Anxiety disorder, unspecified: Secondary | ICD-10-CM | POA: Diagnosis not present

## 2023-07-18 DIAGNOSIS — F32A Depression, unspecified: Secondary | ICD-10-CM | POA: Diagnosis not present

## 2023-07-18 DIAGNOSIS — R5383 Other fatigue: Secondary | ICD-10-CM | POA: Diagnosis not present

## 2023-07-25 DIAGNOSIS — Z1231 Encounter for screening mammogram for malignant neoplasm of breast: Secondary | ICD-10-CM | POA: Diagnosis not present

## 2023-07-25 DIAGNOSIS — Z30432 Encounter for removal of intrauterine contraceptive device: Secondary | ICD-10-CM | POA: Diagnosis not present

## 2023-07-25 DIAGNOSIS — Z681 Body mass index (BMI) 19 or less, adult: Secondary | ICD-10-CM | POA: Diagnosis not present

## 2023-07-25 DIAGNOSIS — Z01419 Encounter for gynecological examination (general) (routine) without abnormal findings: Secondary | ICD-10-CM | POA: Diagnosis not present

## 2023-07-28 DIAGNOSIS — M542 Cervicalgia: Secondary | ICD-10-CM | POA: Diagnosis not present

## 2023-07-31 DIAGNOSIS — F4323 Adjustment disorder with mixed anxiety and depressed mood: Secondary | ICD-10-CM | POA: Diagnosis not present

## 2023-08-01 DIAGNOSIS — R5383 Other fatigue: Secondary | ICD-10-CM | POA: Diagnosis not present

## 2023-08-01 DIAGNOSIS — G4700 Insomnia, unspecified: Secondary | ICD-10-CM | POA: Diagnosis not present

## 2023-08-01 DIAGNOSIS — F419 Anxiety disorder, unspecified: Secondary | ICD-10-CM | POA: Diagnosis not present

## 2023-08-01 DIAGNOSIS — F32A Depression, unspecified: Secondary | ICD-10-CM | POA: Diagnosis not present

## 2023-08-05 DIAGNOSIS — M47812 Spondylosis without myelopathy or radiculopathy, cervical region: Secondary | ICD-10-CM | POA: Diagnosis not present

## 2023-08-05 DIAGNOSIS — M542 Cervicalgia: Secondary | ICD-10-CM | POA: Diagnosis not present

## 2023-08-05 DIAGNOSIS — M5412 Radiculopathy, cervical region: Secondary | ICD-10-CM | POA: Diagnosis not present

## 2023-08-14 DIAGNOSIS — F4323 Adjustment disorder with mixed anxiety and depressed mood: Secondary | ICD-10-CM | POA: Diagnosis not present

## 2023-08-22 DIAGNOSIS — F4323 Adjustment disorder with mixed anxiety and depressed mood: Secondary | ICD-10-CM | POA: Diagnosis not present

## 2023-08-26 DIAGNOSIS — E7800 Pure hypercholesterolemia, unspecified: Secondary | ICD-10-CM | POA: Diagnosis not present

## 2023-08-26 DIAGNOSIS — Z79899 Other long term (current) drug therapy: Secondary | ICD-10-CM | POA: Diagnosis not present

## 2023-08-26 DIAGNOSIS — E559 Vitamin D deficiency, unspecified: Secondary | ICD-10-CM | POA: Diagnosis not present

## 2023-08-27 DIAGNOSIS — D225 Melanocytic nevi of trunk: Secondary | ICD-10-CM | POA: Diagnosis not present

## 2023-08-27 DIAGNOSIS — L814 Other melanin hyperpigmentation: Secondary | ICD-10-CM | POA: Diagnosis not present

## 2023-08-27 DIAGNOSIS — Z86018 Personal history of other benign neoplasm: Secondary | ICD-10-CM | POA: Diagnosis not present

## 2023-08-27 DIAGNOSIS — L821 Other seborrheic keratosis: Secondary | ICD-10-CM | POA: Diagnosis not present

## 2023-08-29 DIAGNOSIS — Z23 Encounter for immunization: Secondary | ICD-10-CM | POA: Diagnosis not present

## 2023-08-29 DIAGNOSIS — Z0000 Encounter for general adult medical examination without abnormal findings: Secondary | ICD-10-CM | POA: Diagnosis not present

## 2023-09-03 DIAGNOSIS — F4323 Adjustment disorder with mixed anxiety and depressed mood: Secondary | ICD-10-CM | POA: Diagnosis not present

## 2023-09-10 DIAGNOSIS — F4323 Adjustment disorder with mixed anxiety and depressed mood: Secondary | ICD-10-CM | POA: Diagnosis not present

## 2023-09-25 DIAGNOSIS — F4323 Adjustment disorder with mixed anxiety and depressed mood: Secondary | ICD-10-CM | POA: Diagnosis not present

## 2023-10-24 DIAGNOSIS — F4323 Adjustment disorder with mixed anxiety and depressed mood: Secondary | ICD-10-CM | POA: Diagnosis not present

## 2023-11-01 DIAGNOSIS — F411 Generalized anxiety disorder: Secondary | ICD-10-CM | POA: Diagnosis not present

## 2023-11-01 DIAGNOSIS — F3342 Major depressive disorder, recurrent, in full remission: Secondary | ICD-10-CM | POA: Diagnosis not present

## 2023-11-12 DIAGNOSIS — F32A Depression, unspecified: Secondary | ICD-10-CM | POA: Diagnosis not present

## 2023-11-12 DIAGNOSIS — K581 Irritable bowel syndrome with constipation: Secondary | ICD-10-CM | POA: Diagnosis not present

## 2023-11-12 DIAGNOSIS — F419 Anxiety disorder, unspecified: Secondary | ICD-10-CM | POA: Diagnosis not present

## 2023-11-12 DIAGNOSIS — E611 Iron deficiency: Secondary | ICD-10-CM | POA: Diagnosis not present

## 2023-11-12 DIAGNOSIS — R5383 Other fatigue: Secondary | ICD-10-CM | POA: Diagnosis not present

## 2023-11-12 DIAGNOSIS — Z1329 Encounter for screening for other suspected endocrine disorder: Secondary | ICD-10-CM | POA: Diagnosis not present

## 2023-11-12 DIAGNOSIS — N959 Unspecified menopausal and perimenopausal disorder: Secondary | ICD-10-CM | POA: Diagnosis not present

## 2023-11-19 DIAGNOSIS — F4323 Adjustment disorder with mixed anxiety and depressed mood: Secondary | ICD-10-CM | POA: Diagnosis not present

## 2023-12-06 DIAGNOSIS — G4700 Insomnia, unspecified: Secondary | ICD-10-CM | POA: Diagnosis not present

## 2023-12-06 DIAGNOSIS — F32A Depression, unspecified: Secondary | ICD-10-CM | POA: Diagnosis not present

## 2023-12-06 DIAGNOSIS — R5383 Other fatigue: Secondary | ICD-10-CM | POA: Diagnosis not present

## 2023-12-06 DIAGNOSIS — F419 Anxiety disorder, unspecified: Secondary | ICD-10-CM | POA: Diagnosis not present

## 2023-12-10 DIAGNOSIS — F4323 Adjustment disorder with mixed anxiety and depressed mood: Secondary | ICD-10-CM | POA: Diagnosis not present

## 2024-01-02 DIAGNOSIS — F4323 Adjustment disorder with mixed anxiety and depressed mood: Secondary | ICD-10-CM | POA: Diagnosis not present

## 2024-01-13 DIAGNOSIS — F4323 Adjustment disorder with mixed anxiety and depressed mood: Secondary | ICD-10-CM | POA: Diagnosis not present

## 2024-02-06 DIAGNOSIS — M542 Cervicalgia: Secondary | ICD-10-CM | POA: Diagnosis not present

## 2024-02-06 DIAGNOSIS — M5481 Occipital neuralgia: Secondary | ICD-10-CM | POA: Diagnosis not present

## 2024-02-06 DIAGNOSIS — M47812 Spondylosis without myelopathy or radiculopathy, cervical region: Secondary | ICD-10-CM | POA: Diagnosis not present

## 2024-02-20 DIAGNOSIS — M5481 Occipital neuralgia: Secondary | ICD-10-CM | POA: Diagnosis not present

## 2024-03-11 DIAGNOSIS — R051 Acute cough: Secondary | ICD-10-CM | POA: Diagnosis not present

## 2024-03-11 DIAGNOSIS — J069 Acute upper respiratory infection, unspecified: Secondary | ICD-10-CM | POA: Diagnosis not present

## 2024-04-20 DIAGNOSIS — F4323 Adjustment disorder with mixed anxiety and depressed mood: Secondary | ICD-10-CM | POA: Diagnosis not present

## 2024-04-27 DIAGNOSIS — U071 COVID-19: Secondary | ICD-10-CM | POA: Diagnosis not present

## 2024-06-01 DIAGNOSIS — F4323 Adjustment disorder with mixed anxiety and depressed mood: Secondary | ICD-10-CM | POA: Diagnosis not present

## 2024-06-12 DIAGNOSIS — R5383 Other fatigue: Secondary | ICD-10-CM | POA: Diagnosis not present

## 2024-06-12 DIAGNOSIS — F32A Depression, unspecified: Secondary | ICD-10-CM | POA: Diagnosis not present

## 2024-06-12 DIAGNOSIS — K581 Irritable bowel syndrome with constipation: Secondary | ICD-10-CM | POA: Diagnosis not present

## 2024-06-12 DIAGNOSIS — E611 Iron deficiency: Secondary | ICD-10-CM | POA: Diagnosis not present

## 2024-06-12 DIAGNOSIS — N959 Unspecified menopausal and perimenopausal disorder: Secondary | ICD-10-CM | POA: Diagnosis not present

## 2024-06-12 DIAGNOSIS — F419 Anxiety disorder, unspecified: Secondary | ICD-10-CM | POA: Diagnosis not present

## 2024-06-12 DIAGNOSIS — E559 Vitamin D deficiency, unspecified: Secondary | ICD-10-CM | POA: Diagnosis not present

## 2024-06-12 DIAGNOSIS — E538 Deficiency of other specified B group vitamins: Secondary | ICD-10-CM | POA: Diagnosis not present

## 2024-06-26 DIAGNOSIS — Z23 Encounter for immunization: Secondary | ICD-10-CM | POA: Diagnosis not present

## 2024-07-13 DIAGNOSIS — F411 Generalized anxiety disorder: Secondary | ICD-10-CM | POA: Diagnosis not present

## 2024-07-13 DIAGNOSIS — F331 Major depressive disorder, recurrent, moderate: Secondary | ICD-10-CM | POA: Diagnosis not present

## 2024-07-29 DIAGNOSIS — F331 Major depressive disorder, recurrent, moderate: Secondary | ICD-10-CM | POA: Diagnosis not present

## 2024-07-29 DIAGNOSIS — F411 Generalized anxiety disorder: Secondary | ICD-10-CM | POA: Diagnosis not present

## 2024-07-30 DIAGNOSIS — Z1231 Encounter for screening mammogram for malignant neoplasm of breast: Secondary | ICD-10-CM | POA: Diagnosis not present

## 2024-07-30 DIAGNOSIS — Z1151 Encounter for screening for human papillomavirus (HPV): Secondary | ICD-10-CM | POA: Diagnosis not present

## 2024-07-30 DIAGNOSIS — Z01419 Encounter for gynecological examination (general) (routine) without abnormal findings: Secondary | ICD-10-CM | POA: Diagnosis not present

## 2024-07-30 DIAGNOSIS — Z124 Encounter for screening for malignant neoplasm of cervix: Secondary | ICD-10-CM | POA: Diagnosis not present

## 2024-07-30 DIAGNOSIS — Z681 Body mass index (BMI) 19 or less, adult: Secondary | ICD-10-CM | POA: Diagnosis not present

## 2024-07-30 DIAGNOSIS — R87810 Cervical high risk human papillomavirus (HPV) DNA test positive: Secondary | ICD-10-CM | POA: Diagnosis not present

## 2024-08-03 DIAGNOSIS — F32A Depression, unspecified: Secondary | ICD-10-CM | POA: Diagnosis not present

## 2024-08-03 DIAGNOSIS — G4700 Insomnia, unspecified: Secondary | ICD-10-CM | POA: Diagnosis not present

## 2024-08-03 DIAGNOSIS — F419 Anxiety disorder, unspecified: Secondary | ICD-10-CM | POA: Diagnosis not present

## 2024-08-03 DIAGNOSIS — F332 Major depressive disorder, recurrent severe without psychotic features: Secondary | ICD-10-CM | POA: Diagnosis not present

## 2024-08-03 DIAGNOSIS — R5383 Other fatigue: Secondary | ICD-10-CM | POA: Diagnosis not present

## 2024-08-04 ENCOUNTER — Other Ambulatory Visit: Payer: Self-pay | Admitting: Obstetrics and Gynecology

## 2024-08-04 DIAGNOSIS — F411 Generalized anxiety disorder: Secondary | ICD-10-CM | POA: Diagnosis not present

## 2024-08-04 DIAGNOSIS — R928 Other abnormal and inconclusive findings on diagnostic imaging of breast: Secondary | ICD-10-CM

## 2024-08-04 DIAGNOSIS — F331 Major depressive disorder, recurrent, moderate: Secondary | ICD-10-CM | POA: Diagnosis not present

## 2024-08-11 DIAGNOSIS — D252 Subserosal leiomyoma of uterus: Secondary | ICD-10-CM | POA: Diagnosis not present

## 2024-08-13 ENCOUNTER — Ambulatory Visit
Admission: RE | Admit: 2024-08-13 | Discharge: 2024-08-13 | Disposition: A | Discharge status: Home / Self Care | Source: Ambulatory Visit | Attending: Obstetrics and Gynecology | Admitting: Obstetrics and Gynecology

## 2024-08-13 ENCOUNTER — Ambulatory Visit

## 2024-08-13 DIAGNOSIS — R928 Other abnormal and inconclusive findings on diagnostic imaging of breast: Secondary | ICD-10-CM

## 2024-08-25 DIAGNOSIS — F332 Major depressive disorder, recurrent severe without psychotic features: Secondary | ICD-10-CM | POA: Diagnosis not present

## 2024-08-26 DIAGNOSIS — F411 Generalized anxiety disorder: Secondary | ICD-10-CM | POA: Diagnosis not present

## 2024-08-26 DIAGNOSIS — F332 Major depressive disorder, recurrent severe without psychotic features: Secondary | ICD-10-CM | POA: Diagnosis not present

## 2024-08-26 DIAGNOSIS — F331 Major depressive disorder, recurrent, moderate: Secondary | ICD-10-CM | POA: Diagnosis not present

## 2024-08-27 DIAGNOSIS — R87610 Atypical squamous cells of undetermined significance on cytologic smear of cervix (ASC-US): Secondary | ICD-10-CM | POA: Diagnosis not present

## 2024-08-28 DIAGNOSIS — Z23 Encounter for immunization: Secondary | ICD-10-CM | POA: Diagnosis not present

## 2024-08-31 DIAGNOSIS — F332 Major depressive disorder, recurrent severe without psychotic features: Secondary | ICD-10-CM | POA: Diagnosis not present

## 2024-09-01 DIAGNOSIS — M5481 Occipital neuralgia: Secondary | ICD-10-CM | POA: Diagnosis not present

## 2024-09-02 DIAGNOSIS — F331 Major depressive disorder, recurrent, moderate: Secondary | ICD-10-CM | POA: Diagnosis not present

## 2024-09-02 DIAGNOSIS — F332 Major depressive disorder, recurrent severe without psychotic features: Secondary | ICD-10-CM | POA: Diagnosis not present

## 2024-09-02 DIAGNOSIS — F411 Generalized anxiety disorder: Secondary | ICD-10-CM | POA: Diagnosis not present

## 2024-09-07 DIAGNOSIS — F332 Major depressive disorder, recurrent severe without psychotic features: Secondary | ICD-10-CM | POA: Diagnosis not present

## 2024-09-08 DIAGNOSIS — F331 Major depressive disorder, recurrent, moderate: Secondary | ICD-10-CM | POA: Diagnosis not present

## 2024-09-08 DIAGNOSIS — F411 Generalized anxiety disorder: Secondary | ICD-10-CM | POA: Diagnosis not present

## 2024-09-16 DIAGNOSIS — Z0000 Encounter for general adult medical examination without abnormal findings: Secondary | ICD-10-CM | POA: Diagnosis not present

## 2024-09-16 DIAGNOSIS — Z79899 Other long term (current) drug therapy: Secondary | ICD-10-CM | POA: Diagnosis not present

## 2024-09-16 DIAGNOSIS — Z86018 Personal history of other benign neoplasm: Secondary | ICD-10-CM | POA: Diagnosis not present

## 2024-09-16 DIAGNOSIS — L814 Other melanin hyperpigmentation: Secondary | ICD-10-CM | POA: Diagnosis not present

## 2024-09-16 DIAGNOSIS — E7800 Pure hypercholesterolemia, unspecified: Secondary | ICD-10-CM | POA: Diagnosis not present

## 2024-09-16 DIAGNOSIS — L821 Other seborrheic keratosis: Secondary | ICD-10-CM | POA: Diagnosis not present

## 2024-09-16 DIAGNOSIS — E559 Vitamin D deficiency, unspecified: Secondary | ICD-10-CM | POA: Diagnosis not present

## 2024-09-16 DIAGNOSIS — D225 Melanocytic nevi of trunk: Secondary | ICD-10-CM | POA: Diagnosis not present

## 2024-09-16 DIAGNOSIS — M542 Cervicalgia: Secondary | ICD-10-CM | POA: Diagnosis not present

## 2024-09-17 DIAGNOSIS — F332 Major depressive disorder, recurrent severe without psychotic features: Secondary | ICD-10-CM | POA: Diagnosis not present

## 2024-09-21 DIAGNOSIS — F332 Major depressive disorder, recurrent severe without psychotic features: Secondary | ICD-10-CM | POA: Diagnosis not present

## 2024-09-24 ENCOUNTER — Other Ambulatory Visit: Payer: Self-pay | Admitting: Family Medicine

## 2024-09-24 DIAGNOSIS — R92343 Mammographic extreme density, bilateral breasts: Secondary | ICD-10-CM

## 2024-09-25 DIAGNOSIS — F332 Major depressive disorder, recurrent severe without psychotic features: Secondary | ICD-10-CM | POA: Diagnosis not present

## 2024-09-28 DIAGNOSIS — F332 Major depressive disorder, recurrent severe without psychotic features: Secondary | ICD-10-CM | POA: Diagnosis not present

## 2024-10-02 DIAGNOSIS — F332 Major depressive disorder, recurrent severe without psychotic features: Secondary | ICD-10-CM | POA: Diagnosis not present

## 2024-10-05 DIAGNOSIS — F332 Major depressive disorder, recurrent severe without psychotic features: Secondary | ICD-10-CM | POA: Diagnosis not present

## 2024-10-31 ENCOUNTER — Ambulatory Visit
Admission: RE | Admit: 2024-10-31 | Discharge: 2024-10-31 | Disposition: A | Source: Ambulatory Visit | Attending: Family Medicine

## 2024-10-31 DIAGNOSIS — R92343 Mammographic extreme density, bilateral breasts: Secondary | ICD-10-CM

## 2024-10-31 MED ORDER — GADOPICLENOL 0.5 MMOL/ML IV SOLN
6.0000 mL | Freq: Once | INTRAVENOUS | Status: AC | PRN
  Administered 2024-10-31: 6 mL via INTRAVENOUS
